# Patient Record
Sex: Male | Born: 1995 | Race: Black or African American | Hispanic: No | Marital: Single | Smoking: Current some day smoker
Health system: Southern US, Community
[De-identification: ages and names within clinical notes are randomized; demographics above are authoritative.]

## PROBLEM LIST (undated history)

## (undated) DIAGNOSIS — R011 Cardiac murmur, unspecified: Secondary | ICD-10-CM

## (undated) DIAGNOSIS — S82899A Other fracture of unspecified lower leg, initial encounter for closed fracture: Secondary | ICD-10-CM

## (undated) DIAGNOSIS — Z8781 Personal history of (healed) traumatic fracture: Secondary | ICD-10-CM

## (undated) HISTORY — PX: OTHER SURGICAL HISTORY: SHX169

---
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
## 2014-05-18 ENCOUNTER — Encounter (HOSPITAL_COMMUNITY): Payer: Self-pay

## 2014-05-18 ENCOUNTER — Emergency Department (HOSPITAL_COMMUNITY): Payer: Medicaid Other

## 2014-05-18 ENCOUNTER — Emergency Department (HOSPITAL_COMMUNITY)
Admission: EM | Admit: 2014-05-18 | Discharge: 2014-05-18 | Disposition: A | Discharge status: Home / Self Care | Payer: Medicaid Other | Attending: Emergency Medicine | Admitting: Emergency Medicine

## 2014-05-18 DIAGNOSIS — R011 Cardiac murmur, unspecified: Secondary | ICD-10-CM | POA: Diagnosis not present

## 2014-05-18 DIAGNOSIS — R6889 Other general symptoms and signs: Secondary | ICD-10-CM

## 2014-05-18 DIAGNOSIS — R197 Diarrhea, unspecified: Secondary | ICD-10-CM | POA: Diagnosis not present

## 2014-05-18 DIAGNOSIS — Z8781 Personal history of (healed) traumatic fracture: Secondary | ICD-10-CM | POA: Diagnosis not present

## 2014-05-18 DIAGNOSIS — R112 Nausea with vomiting, unspecified: Secondary | ICD-10-CM | POA: Insufficient documentation

## 2014-05-18 DIAGNOSIS — Z7952 Long term (current) use of systemic steroids: Secondary | ICD-10-CM | POA: Insufficient documentation

## 2014-05-18 DIAGNOSIS — Z79899 Other long term (current) drug therapy: Secondary | ICD-10-CM | POA: Insufficient documentation

## 2014-05-18 DIAGNOSIS — R079 Chest pain, unspecified: Secondary | ICD-10-CM

## 2014-05-18 DIAGNOSIS — R05 Cough: Secondary | ICD-10-CM | POA: Insufficient documentation

## 2014-05-18 MED ORDER — ONDANSETRON 4 MG PO TBDP: 4.0000 mg | ORAL_TABLET | Freq: Three times a day (TID) | ORAL | Status: DC | PRN

## 2014-05-18 MED ORDER — HYDROCODONE-HOMATROPINE 5-1.5 MG/5ML PO SYRP
5.0000 mL | ORAL_SOLUTION | Freq: Once | ORAL | Status: AC
  Administered 2014-05-18: 5 mL via ORAL
  Filled 2014-05-18: qty 5

## 2014-05-18 MED ORDER — ONDANSETRON 4 MG PO TBDP: 8.0000 mg | ORAL_TABLET | Freq: Once | ORAL | Status: DC

## 2014-05-18 MED ORDER — ONDANSETRON 4 MG PO TBDP
4.0000 mg | ORAL_TABLET | Freq: Once | ORAL | Status: AC
Start: 2014-05-18 — End: 2014-05-18
  Administered 2014-05-18: 4 mg via ORAL
  Filled 2014-05-18: qty 1

## 2014-05-18 NOTE — ED Provider Notes (Signed)
  Face-to-face evaluation   History: He complains of intermittent chest pain associated with vomiting and diarrhea for several days.  Chest pain is left anterior, and resolves within 15 minutes.  He feels like the pain started before the vomiting.  He denies fever, chills, weakness or dizziness.  He complains of a chronic cardiac murmur but does not see a physician about it.  His never had surgery for it.  Physical exam: Alert, calm, cooperative.  Heart regular rate and rhythm without murmur.  Lungs clear to auscultation.  Abdomen soft and nontender.  Extremities without edema, and they are nontender.  Chest x-ray, ordered to evaluate for cardiomegaly   EKG Interpretation  Date/Time:  Sunday May 18 2014 15:10:16 EST Ventricular Rate:  57 PR Interval:  164 QRS Duration: 89 QT Interval:  386 QTC Calculation: 376 R Axis:   81 Text Interpretation:  Age not entered, assumed to be  18 years old for purpose of ECG interpretation Sinus rhythm Left ventricular hypertrophy ST elev, probable normal early repol pattern No old tracing to compare Confirmed by [facility]  MD, Demiana Crumbley [phone]([id number]) on 05/18/2014 3:13:41 PM        Medical screening examination/treatment/procedure(s) were conducted as a shared visit with non-physician practitioner(s) and myself.  I personally evaluated the patient during the encounter  Flint MelterElliott L Kyson Kupper, MD 05/18/14 2342

## 2014-05-18 NOTE — Discharge Instructions (Signed)
Please follow up with your primary care physician in 1-2 days. If you do not have one please call the [facility] number listed above. Please alternate between Motrin and Tylenol every three hours for fevers and pain. Please read all discharge instructions and return precautions.   Chest Pain (Nonspecific) It is often hard to give a specific diagnosis for the cause of chest pain. There is always a chance that your pain could be related to something serious, such as a heart attack or a blood clot in the lungs. You need to follow up with your health care provider for further evaluation. CAUSES   Heartburn.  Pneumonia or bronchitis.  Anxiety or stress.  Inflammation around your heart (pericarditis) or lung (pleuritis or pleurisy).  A blood clot in the lung.  A collapsed lung (pneumothorax). It can develop suddenly on its own (spontaneous pneumothorax) or from trauma to the chest.  Shingles infection (herpes zoster virus). The chest wall is composed of bones, muscles, and cartilage. Any of these can be the source of the pain.  The bones can be bruised by injury.  The muscles or cartilage can be strained by coughing or overwork.  The cartilage can be affected by inflammation and become sore (costochondritis). DIAGNOSIS  Lab tests or other studies may be needed to find the cause of your pain. Your health care provider may have you take a test called an ambulatory electrocardiogram (ECG). An ECG records your heartbeat patterns over a 24-hour period. You may also have other tests, such as:  Transthoracic echocardiogram (TTE). During echocardiography, sound waves are used to evaluate how blood flows through your heart.  Transesophageal echocardiogram (TEE).  Cardiac monitoring. This allows your health care provider to monitor your heart rate and rhythm in real time.  Holter monitor. This is a portable device that records your heartbeat and can help diagnose heart  arrhythmias. It allows your health care provider to track your heart activity for several days, if needed.  Stress tests by exercise or by giving medicine that makes the heart beat faster. TREATMENT   Treatment depends on what may be causing your chest pain. Treatment may include:  Acid blockers for heartburn.  Anti-inflammatory medicine.  Pain medicine for inflammatory conditions.  Antibiotics if an infection is present.  You may be advised to change lifestyle habits. This includes stopping smoking and avoiding alcohol, caffeine, and chocolate.  You may be advised to keep your head raised (elevated) when sleeping. This reduces the chance of acid going backward from your stomach into your esophagus. Most of the time, nonspecific chest pain will improve within 2-3 days with rest and mild pain medicine.  HOME CARE INSTRUCTIONS   If antibiotics were prescribed, take them as directed. Finish them even if you start to feel better.  For the next few days, avoid physical activities that bring on chest pain. Continue physical activities as directed.  Do not use any tobacco products, including cigarettes, chewing tobacco, or electronic cigarettes.  Avoid drinking alcohol.  Only take medicine as directed by your health care provider.  Follow your health care provider's suggestions for further testing if your chest pain does not go away.  Keep any follow-up appointments you made. If you do not go to an appointment, you could develop lasting (chronic) problems with pain. If there is any problem keeping an appointment, call to reschedule. SEEK MEDICAL CARE IF:   Your chest pain does not go away, even after treatment.  You have  a rash with blisters on your chest.  You have a fever. SEEK IMMEDIATE MEDICAL CARE IF:   You have increased chest pain or pain that spreads to your arm, neck, jaw, back, or abdomen.  You have shortness of breath.  You have an increasing cough, or you cough up  blood.  You have severe back or abdominal pain.  You feel nauseous or vomit.  You have severe weakness.  You faint.  You have chills. This is an emergency. Do not wait to see if the pain will go away. Get medical help at once. Call your local emergency services (911 in [place] Do not drive yourself to the hospital. MAKE SURE YOU:   Understand these instructions.  Will watch your condition.  Will get help right away if you are not doing well or get worse. Document Released: 02/23/2005 Document Revised: 05/21/2013 Document Reviewed: 12/20/2007 [facility]ExitCare Patient Information 2015 [employer] [place]. This information is not intended to replace advice given to you by your health care provider. Make sure you discuss any questions you have with your health care provider.   Influenza Influenza ("the flu") is a viral infection of the respiratory tract. It occurs more often in winter months because people spend more time in close contact with one another. Influenza can make you feel very sick. Influenza easily spreads from person to person (contagious). CAUSES  Influenza is caused by a virus that infects the respiratory tract. You can catch the virus by breathing in droplets from an infected person's cough or sneeze. You can also catch the virus by touching something that was recently contaminated with the virus and then touching your mouth, nose, or eyes. RISKS AND COMPLICATIONS You may be at risk for a more severe case of influenza if you smoke cigarettes, have diabetes, have chronic heart disease (such as heart failure) or lung disease (such as asthma), or if you have a weakened immune system. Elderly people and pregnant women are also at risk for more serious infections. The most common problem of influenza is a lung infection (pneumonia). Sometimes, this problem can require emergency medical care and may be life threatening. SIGNS AND SYMPTOMS  Symptoms typically last 4 to 10 days and may  include:  Fever.  Chills.  Headache, body aches, and muscle aches.  Sore throat.  Chest discomfort and cough.  Poor appetite.  Weakness or feeling tired.  Dizziness.  Nausea or vomiting. DIAGNOSIS  Diagnosis of influenza is often made based on your history and a physical exam. A nose or throat swab test can be done to confirm the diagnosis. TREATMENT  In mild cases, influenza goes away on its own. Treatment is directed at relieving symptoms. For more severe cases, your health care provider may prescribe antiviral medicines to shorten the sickness. Antibiotic medicines are not effective because the infection is caused by a virus, not by bacteria. HOME CARE INSTRUCTIONS  Take medicines only as directed by your health care provider.  Use a cool mist humidifier to make breathing easier.  Get plenty of rest until your temperature returns to normal. This usually takes 3 to 4 days.  Drink enough fluid to keep your urine clear or pale yellow.  Cover yourmouth and nosewhen coughing or sneezing,and wash your handswellto prevent thevirusfrom spreading.  Stay homefromwork orschool untilthe fever is gonefor at least 111full day. PREVENTION  An annual influenza vaccination (flu shot) is the best way to avoid getting influenza. An annual flu shot is now routinely recommended for all adults  in the [place] SEEK MEDICAL CARE IF:  You experiencechest pain, yourcough worsens,or you producemore mucus.  Youhave nausea,vomiting, ordiarrhea.  Your fever returns or gets worse. SEEK IMMEDIATE MEDICAL CARE IF:  You havetrouble breathing, you become short of breath,or your skin ornails becomebluish.  You have severe painor stiffnessin the neck.  You develop a sudden headache, or pain in the face or ear.  You have nausea or vomiting that you cannot control. MAKE SURE YOU:   Understand these instructions.  Will watch your condition.  Will get help right away if you  are not doing well or get worse. Document Released: 05/13/2000 Document Revised: 09/30/2013 Document Reviewed: 08/15/2011 [facility] Patient Information 2015 [place], [place]. This information is not intended to replace advice given to you by your health care provider. Make sure you discuss any questions you have with your health care provider.

## 2014-05-18 NOTE — ED Notes (Signed)
Per ems - c/o left-sided chest pain in mid-rib area w/ no radiation, 8/10. Pt reports nonproductive cough, pain increases with palpation and deep resp. No cardiac history. Pt c/o vomiting on and off over last few days and increased bowel movements. Chest pain has been the past 3 days. BP 136 palp, pulse 60, resp 16.

## 2014-05-18 NOTE — ED Provider Notes (Signed)
CSN: [id number]     Arrival date & time 05/18/14  1510 History   First MD Initiated Contact with Patient 05/18/14 1516     Chief Complaint  Patient presents with  . Chest Pain     (Consider location/radiation/quality/duration/timing/severity/associated sxs/prior Treatment) HPI Comments: Patient is a 18 yo M PMHx significant for heart murmur presenting to the ED for left sided chest pain with associated non-productive cough, nasal congestion, rhinorrhea, nausea, non-bloody non-bilious emesis, and non-bloody diarrhea for the last three days. He states his chest pain worsened today. No medications tried PTA. No sick contacts. PERC negative. No early familial cardiac history.   Patient is a 18 y.o. male presenting with chest pain.  Chest Pain Associated symptoms: cough, nausea and vomiting     Past Medical History  Diagnosis Date  . Heart murmur   . Broken ankle   . History of broken finger    History reviewed. No pertinent past surgical history. History reviewed. No pertinent family history. History  Substance Use Topics  . Smoking status: Never Smoker   . Smokeless tobacco: Not on file  . Alcohol Use: No    Review of Systems  HENT: Positive for congestion and rhinorrhea.   Respiratory: Positive for cough.   Cardiovascular: Positive for chest pain.  Gastrointestinal: Positive for nausea, vomiting and diarrhea.  All other systems reviewed and are negative.     Allergies  Review of patient's allergies indicates no known allergies.  Home Medications   Prior to Admission medications   Medication Sig Start Date End Date Taking? Authorizing Provider  acetaminophen (TYLENOL) 325 MG tablet Take 650 mg by mouth every 6 (six) hours as needed for headache.   Yes Historical Provider, MD  diphenhydrAMINE (BENADRYL) 25 MG tablet Take 1 tablet (25 mg total) by mouth every 6 (six) hours. Patient not taking: Reported on 05/18/2014 12/28/13   Trixie DredgeEmily West, PA-C  ibuprofen (ADVIL,MOTRIN)  800 MG tablet Take 800 mg by mouth every 8 (eight) hours as needed for mild pain.    Historical Provider, MD  ondansetron (ZOFRAN ODT) 4 MG disintegrating tablet Take 1 tablet (4 mg total) by mouth every 8 (eight) hours as needed for nausea or vomiting. 05/18/14   Lise AuerJennifer L Matisyn Cabeza, PA-C  permethrin (ELIMITE) 5 % cream Apply to affected area once.  Repeat once in 7 days if needed. Patient not taking: Reported on 05/18/2014 12/28/13   Trixie DredgeEmily West, PA-C  predniSONE (STERAPRED UNI-PAK) 10 MG tablet Take by mouth daily. Day 1: take 6 tabs.  Day 2: 5 tabs  Day 3: 4 tabs  Day 4: 3 tabs  Day 5: 2 tabs  Day 6: 1 tab Patient not taking: Reported on 05/18/2014 12/28/13   Trixie DredgeEmily West, PA-C  trimethoprim-polymyxin b (POLYTRIM) ophthalmic solution Place 1 drop into the right eye every 4 (four) hours. While awake X 7 days Patient not taking: Reported on 05/18/2014 12/28/13   Trixie DredgeEmily West, PA-C   BP 138/84 mmHg  Pulse 42  Temp(Src) 98 F (36.7 C) (Oral)  Resp 14  SpO2 100% Physical Exam  Constitutional: He is oriented to person, place, and time. He appears well-developed and well-nourished. No distress.  HENT:  Head: Normocephalic and atraumatic.  Right Ear: External ear normal.  Left Ear: External ear normal.  Nose: Rhinorrhea present.  Mouth/Throat: Oropharynx is clear and moist. No oropharyngeal exudate.  Nasal congestion.   Eyes: Conjunctivae are normal.  Neck: Normal range of motion. Neck supple.  Cardiovascular: Normal rate, regular rhythm  and normal heart sounds.   Pulmonary/Chest: Effort normal and breath sounds normal. No respiratory distress. He exhibits tenderness. He exhibits no crepitus, no deformity and no swelling.    Abdominal: Soft. Bowel sounds are normal. There is no tenderness.  Musculoskeletal: Normal range of motion. He exhibits no edema.  Lymphadenopathy:    He has no cervical adenopathy.  Neurological: He is alert and oriented to person, place, and time.  Skin: Skin is warm and  dry. He is not diaphoretic.  Psychiatric: He has a normal mood and affect.  Nursing note and vitals reviewed.   ED Course  Procedures (including critical care time) Medications  ondansetron (ZOFRAN-ODT) disintegrating tablet 4 mg (4 mg Oral Given 05/18/14 1544)  HYDROcodone-homatropine (HYCODAN) 5-1.5 MG/5ML syrup 5 mL (5 mLs Oral Given 05/18/14 1544)    Labs Review Labs Reviewed - No data to display  Imaging Review Dg Chest 2 View  05/18/2014   CLINICAL DATA:  Left-sided chest pain and cough for 3 days. Shortness of breath for 1 day.  EXAM: CHEST  2 VIEW  COMPARISON:  None.  FINDINGS: Heart size and mediastinal contours are within normal limits. Both lungs are clear. Visualized skeletal structures are unremarkable.  IMPRESSION: Negative exam.   Electronically Signed   By: Drusilla Kannerhomas  Dalessio M.D.   On: 05/18/2014 16:26     EKG Interpretation   Date/Time:  Sunday May 18 2014 15:10:16 EST Ventricular Rate:  57 PR Interval:  164 QRS Duration: 89 QT Interval:  386 QTC Calculation: 376 R Axis:   81 Text Interpretation:  Age not entered, assumed to be  18 years old for  purpose of ECG interpretation Sinus rhythm Left ventricular hypertrophy ST  elev, probable normal early repol pattern No old tracing to compare  Confirmed by [facility]  MD, ELLIOTT [phone]([id number]) on 05/18/2014 3:13:41 PM      MDM   Final diagnoses:  Chest pain  Flu-like symptoms    Filed Vitals:   05/18/14 1640  BP: 138/84  Pulse: 42  Temp:   Resp: 14   Afebrile, NAD, non-toxic appearing, AAOx4.   1) Flu like symptoms: Patient with symptoms consistent with influenza.  Vitals are stable, low-grade fever.  No signs of dehydration, tolerating PO's.  Lungs are clear.    2) CP: Patient is to be discharged with recommendation to follow up with PCP in regards to today's hospital visit. Chest pain is not likely of cardiac or pulmonary etiology d/t presentation, perc negative, VSS, no tracheal deviation, no JVD or  new murmur, RRR, breath sounds equal bilaterally, EKG without acute abnormalities, negative troponin, and negative CXR. Pt has been advised to return to the ED is CP becomes exertional, associated with diaphoresis or nausea, radiates to left jaw/arm, worsens or becomes concerning in any way. Pt appears reliable for follow up and is agreeable to discharge.   Case has been discussed with and seen by Dr. Effie ShyWentz who agrees with the above plan to discharge.       Jeannetta EllisJennifer L Janiece Scovill, PA-C 05/18/14 2018  Flint MelterElliott L Wentz, MD 05/18/14 [id number]

## 2014-06-29 ENCOUNTER — Encounter (HOSPITAL_COMMUNITY): Payer: Self-pay | Admitting: Cardiology

## 2014-06-29 ENCOUNTER — Emergency Department (HOSPITAL_COMMUNITY)
Admission: EM | Admit: 2014-06-29 | Discharge: 2014-06-29 | Disposition: A | Discharge status: Home / Self Care | Payer: Medicaid Other | Attending: Emergency Medicine | Admitting: Emergency Medicine

## 2014-06-29 DIAGNOSIS — S39012A Strain of muscle, fascia and tendon of lower back, initial encounter: Secondary | ICD-10-CM | POA: Diagnosis not present

## 2014-06-29 DIAGNOSIS — Z7952 Long term (current) use of systemic steroids: Secondary | ICD-10-CM | POA: Insufficient documentation

## 2014-06-29 DIAGNOSIS — R011 Cardiac murmur, unspecified: Secondary | ICD-10-CM | POA: Insufficient documentation

## 2014-06-29 DIAGNOSIS — S3992XA Unspecified injury of lower back, initial encounter: Secondary | ICD-10-CM | POA: Diagnosis present

## 2014-06-29 DIAGNOSIS — Y998 Other external cause status: Secondary | ICD-10-CM | POA: Diagnosis not present

## 2014-06-29 DIAGNOSIS — X58XXXA Exposure to other specified factors, initial encounter: Secondary | ICD-10-CM | POA: Diagnosis not present

## 2014-06-29 DIAGNOSIS — Y9289 Other specified places as the place of occurrence of the external cause: Secondary | ICD-10-CM | POA: Diagnosis not present

## 2014-06-29 DIAGNOSIS — Y9389 Activity, other specified: Secondary | ICD-10-CM | POA: Diagnosis not present

## 2014-06-29 MED ORDER — NAPROXEN 500 MG PO TABS: 500.0000 mg | ORAL_TABLET | Freq: Two times a day (BID) | ORAL | Status: DC

## 2014-06-29 MED ORDER — METHOCARBAMOL 500 MG PO TABS: 1000.0000 mg | ORAL_TABLET | Freq: Four times a day (QID) | ORAL | Status: DC

## 2014-06-29 NOTE — ED Provider Notes (Signed)
CSN: [id number]     Arrival date & time 06/29/14  1151 History   First MD Initiated Contact with Patient 06/29/14 1248     Chief Complaint  Patient presents with  . Back Pain     (Consider location/radiation/quality/duration/timing/severity/associated sxs/prior Treatment) HPI Comments: Patient present with complaint of lower back pain for the past 2 days. Pain is across his lower back. It is dull and worse with movement. It does not radiate into his legs. Patient took Tylenol without relief. Pain was worse this morning. Patient denies warning symptoms of back pain including: fecal incontinence, urinary retention or overflow incontinence, night sweats, waking from sleep with back pain, unexplained fevers or weight loss, h/o cancer, IVDU, recent trauma.  No history of back problems.   Patient is a 19 y.o. male presenting with back pain. The history is provided by the patient.  Back Pain Associated symptoms: no fever, no numbness and no weakness     Past Medical History  Diagnosis Date  . Heart murmur   . Broken ankle   . History of broken finger    History reviewed. No pertinent past surgical history. History reviewed. No pertinent family history. History  Substance Use Topics  . Smoking status: Never Smoker   . Smokeless tobacco: Not on file  . Alcohol Use: No    Review of Systems  Constitutional: Negative for fever and unexpected weight change.  Gastrointestinal: Negative for constipation.       Neg for fecal incontinence  Genitourinary: Negative for hematuria, flank pain and difficulty urinating.       Negative for urinary incontinence or retention  Musculoskeletal: Positive for back pain.  Neurological: Negative for weakness and numbness.       Negative for saddle paresthesias       Allergies  Review of patient's allergies indicates no known allergies.  Home Medications   Prior to Admission medications   Medication Sig Start Date End Date Taking? Authorizing  Provider  acetaminophen (TYLENOL) 325 MG tablet Take 650 mg by mouth every 6 (six) hours as needed for headache.    Historical Provider, MD  diphenhydrAMINE (BENADRYL) 25 MG tablet Take 1 tablet (25 mg total) by mouth every 6 (six) hours. Patient not taking: Reported on 05/18/2014 12/28/13   Trixie DredgeEmily West, PA-C  ibuprofen (ADVIL,MOTRIN) 800 MG tablet Take 800 mg by mouth every 8 (eight) hours as needed for mild pain.    Historical Provider, MD  ondansetron (ZOFRAN ODT) 4 MG disintegrating tablet Take 1 tablet (4 mg total) by mouth every 8 (eight) hours as needed for nausea or vomiting. 05/18/14   Lise AuerJennifer L Piepenbrink, PA-C  permethrin (ELIMITE) 5 % cream Apply to affected area once.  Repeat once in 7 days if needed. Patient not taking: Reported on 05/18/2014 12/28/13   Trixie DredgeEmily West, PA-C  predniSONE (STERAPRED UNI-PAK) 10 MG tablet Take by mouth daily. Day 1: take 6 tabs.  Day 2: 5 tabs  Day 3: 4 tabs  Day 4: 3 tabs  Day 5: 2 tabs  Day 6: 1 tab Patient not taking: Reported on 05/18/2014 12/28/13   Trixie DredgeEmily West, PA-C  trimethoprim-polymyxin b (POLYTRIM) ophthalmic solution Place 1 drop into the right eye every 4 (four) hours. While awake X 7 days Patient not taking: Reported on 05/18/2014 12/28/13   Trixie DredgeEmily West, PA-C   BP 130/63 mmHg  Pulse 67  Temp(Src) 98.1 F (36.7 C) (Oral)  Resp 16  SpO2 97% Physical Exam  Constitutional: He appears well-developed and  well-nourished.  HENT:  Head: Normocephalic and atraumatic.  Eyes: Conjunctivae are normal.  Neck: Normal range of motion.  Abdominal: Soft. There is no tenderness. There is no CVA tenderness.  Musculoskeletal: Normal range of motion.       Cervical back: He exhibits normal range of motion, no tenderness and no bony tenderness.       Thoracic back: He exhibits normal range of motion, no tenderness and no bony tenderness.       Lumbar back: He exhibits tenderness. He exhibits normal range of motion and no bony tenderness.       Back:  No step-off  noted with palpation of spine.   Neurological: He is alert. He has normal reflexes. No sensory deficit. He exhibits normal muscle tone.  5/5 strength in entire lower extremities bilaterally. No sensation deficit.   Skin: Skin is warm and dry.  Psychiatric: He has a normal mood and affect.  Nursing note and vitals reviewed.   ED Course  Procedures (including critical care time) Labs Review Labs Reviewed - No data to display  Imaging Review No results found.   EKG Interpretation None       12:53 PM Patient seen and examined. Work-up initiated. Medications ordered.   Vital signs reviewed and are as follows: Filed Vitals:   06/29/14 1212  BP: 130/63  Pulse: 67  Temp: 98.1 F (36.7 C)  Resp: 16    No red flag s/s of low back pain. Patient was counseled on back pain precautions and told to do activity as tolerated but do not lift, push, or pull heavy objects more than 10 pounds for the next week.  Patient counseled to use ice or heat on back for no longer than 15 minutes every hour.   Patient prescribed muscle relaxer and counseled on proper use of muscle relaxant medication.    Urged patient not to drink alcohol, drive, or perform any other activities that requires focus while taking either of these medications.  Patient urged to follow-up with PCP if pain does not improve with treatment and rest or if pain becomes recurrent. Urged to return with worsening severe pain, loss of bowel or bladder control, trouble walking.   The patient verbalizes understanding and agrees with the plan.     MDM   Final diagnoses:  Lumbar strain, initial encounter   Patient with back pain, musculoskeletal in nature. No neurological deficits. Patient is ambulatory. No warning symptoms of back pain including: fecal incontinence, urinary retention or overflow incontinence, night sweats, waking from sleep with back pain, unexplained fevers or weight loss, h/o cancer, IVDU, recent trauma. No  concern for cauda equina, epidural abscess, or other serious cause of back pain. Conservative measures such as rest, ice/heat and pain medicine indicated with PCP follow-up if no improvement with conservative management.      Renne Crigler, PA-C 06/29/14 1301  Gerhard Munch, MD 06/29/14 [phone]

## 2014-06-29 NOTE — Discharge Instructions (Signed)
Please read and follow all provided instructions.  Your diagnoses today include:  1. Lumbar strain, initial encounter    Tests performed today include:  Vital signs - see below for your results today  Medications prescribed:   Robaxin (methocarbamol) - muscle relaxer medication  DO NOT drive or perform any activities that require you to be awake and alert because this medicine can make you drowsy.    Naproxen - anti-inflammatory pain medication  Do not exceed 500mg  naproxen every 12 hours, take with food  You have been prescribed an anti-inflammatory medication or NSAID. Take with food. Take smallest effective dose for the shortest duration needed for your pain. Stop taking if you experience stomach pain or vomiting.   Take any prescribed medications only as directed.  Home care instructions:   Follow any educational materials contained in this packet  Please rest, use ice or heat on your back for the next several days  Do not lift, push, pull anything more than 10 pounds for the next week  Follow-up instructions: Please follow-up with your primary care provider in the next 1 week for further evaluation of your symptoms.   Return instructions:  SEEK IMMEDIATE MEDICAL ATTENTION IF YOU HAVE:  New numbness, tingling, weakness, or problem with the use of your arms or legs  Severe back pain not relieved with medications  Loss control of your bowels or bladder  Increasing pain in any areas of the body (such as chest or abdominal pain)  Shortness of breath, dizziness, or fainting.   Worsening nausea (feeling sick to your stomach), vomiting, fever, or sweats  Any other emergent concerns regarding your health   Additional Information:  Your vital signs today were: BP 130/63 mmHg   Pulse 67   Temp(Src) 98.1 F (36.7 C) (Oral)   Resp 16   SpO2 97% If your blood pressure (BP) was elevated above 135/85 this visit, please have this repeated by your doctor within one  month. --------------

## 2014-06-29 NOTE — ED Notes (Signed)
Pt reports lower back pain for the past couple of days. 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

## 2014-06-29 NOTE — ED Notes (Signed)
Declined W/C at D/C and was escorted to lobby by RN. 

## 2014-09-23 ENCOUNTER — Emergency Department (HOSPITAL_COMMUNITY): Payer: Medicaid Other

## 2014-09-23 ENCOUNTER — Encounter (HOSPITAL_COMMUNITY): Payer: Self-pay | Admitting: Emergency Medicine

## 2014-09-23 ENCOUNTER — Emergency Department (HOSPITAL_COMMUNITY)
Admission: EM | Admit: 2014-09-23 | Discharge: 2014-09-23 | Disposition: A | Discharge status: Home / Self Care | Payer: Medicaid Other | Attending: Emergency Medicine | Admitting: Emergency Medicine

## 2014-09-23 DIAGNOSIS — Z87828 Personal history of other (healed) physical injury and trauma: Secondary | ICD-10-CM | POA: Insufficient documentation

## 2014-09-23 DIAGNOSIS — Z791 Long term (current) use of non-steroidal anti-inflammatories (NSAID): Secondary | ICD-10-CM | POA: Diagnosis not present

## 2014-09-23 DIAGNOSIS — Z79899 Other long term (current) drug therapy: Secondary | ICD-10-CM | POA: Diagnosis not present

## 2014-09-23 DIAGNOSIS — R011 Cardiac murmur, unspecified: Secondary | ICD-10-CM | POA: Insufficient documentation

## 2014-09-23 DIAGNOSIS — Y9389 Activity, other specified: Secondary | ICD-10-CM | POA: Diagnosis not present

## 2014-09-23 DIAGNOSIS — Y9289 Other specified places as the place of occurrence of the external cause: Secondary | ICD-10-CM | POA: Diagnosis not present

## 2014-09-23 DIAGNOSIS — Y999 Unspecified external cause status: Secondary | ICD-10-CM | POA: Diagnosis not present

## 2014-09-23 DIAGNOSIS — W2209XA Striking against other stationary object, initial encounter: Secondary | ICD-10-CM | POA: Insufficient documentation

## 2014-09-23 DIAGNOSIS — S6991XA Unspecified injury of right wrist, hand and finger(s), initial encounter: Secondary | ICD-10-CM | POA: Diagnosis present

## 2014-09-23 DIAGNOSIS — S62336A Displaced fracture of neck of fifth metacarpal bone, right hand, initial encounter for closed fracture: Secondary | ICD-10-CM | POA: Insufficient documentation

## 2014-09-23 DIAGNOSIS — S62308A Unspecified fracture of other metacarpal bone, initial encounter for closed fracture: Secondary | ICD-10-CM

## 2014-09-23 MED ORDER — HYDROCODONE-ACETAMINOPHEN 5-325 MG PO TABS: ORAL_TABLET | ORAL | Status: DC

## 2014-09-23 MED ORDER — NAPROXEN 500 MG PO TABS: 500.0000 mg | ORAL_TABLET | Freq: Two times a day (BID) | ORAL | Status: DC

## 2014-09-23 MED ORDER — OXYCODONE-ACETAMINOPHEN 5-325 MG PO TABS
1.0000 | ORAL_TABLET | Freq: Once | ORAL | Status: AC
  Administered 2014-09-23: 1 via ORAL
  Filled 2014-09-23: qty 1

## 2014-09-23 NOTE — ED Provider Notes (Signed)
CSN: [id number]     Arrival date & time 09/23/14  1345 History  This chart was scribed for Sean BleacherJosh Devan Babino, PA working with Vanetta MuldersScott Zackowski, MD by Elveria Risingimelie Horne, ED Scribe. This patient was seen in room TR05C/TR05C and the patient's care was started at 2:14 PM.   CC: R hand pain and swelling  The history is provided by the patient. No language interpreter was used.   HPI Comments: Sean Conway is a 19 y.o. male who presents to the Emergency Department right hand injury incurred yesterday. Patient uncertain of the mechanism of his injury but suspects he sustained it during rough horseplay with his girlfriend. Patient reports that immediately following the injury he did experience some mild pain, but he ignored it. Patient reports waking up this morning with swelling and pain to dorsum, discomfort with forming a fist and dangling his hand. Patient reports attempted treatment with Tylenol and ibuprofen without relief.   Past Medical History  Diagnosis Date  . Heart murmur   . Broken ankle   . History of broken finger    History reviewed. No pertinent past surgical history. No family history on file. History  Substance Use Topics  . Smoking status: Never Smoker   . Smokeless tobacco: Not on file  . Alcohol Use: No    Review of Systems  Constitutional: Positive for activity change. Negative for fever.  Musculoskeletal: Positive for joint swelling and arthralgias. Negative for back pain, gait problem and neck pain.  Skin: Negative for color change and wound.  Neurological: Negative for weakness and numbness.    Allergies  Review of patient's allergies indicates no known allergies.  Home Medications   Prior to Admission medications   Medication Sig Start Date End Date Taking? Authorizing Provider  acetaminophen (TYLENOL) 325 MG tablet Take 650 mg by mouth every 6 (six) hours as needed for headache.    Historical Provider, MD  ibuprofen (ADVIL,MOTRIN) 800 MG tablet Take 800 mg by mouth  every 8 (eight) hours as needed for mild pain.    Historical Provider, MD  methocarbamol (ROBAXIN) 500 MG tablet Take 2 tablets (1,000 mg total) by mouth 4 (four) times daily. 06/29/14   Renne CriglerJoshua Oneil Behney, PA-C  naproxen (NAPROSYN) 500 MG tablet Take 1 tablet (500 mg total) by mouth 2 (two) times daily. 06/29/14   Renne CriglerJoshua Kieron Kantner, PA-C  ondansetron (ZOFRAN ODT) 4 MG disintegrating tablet Take 1 tablet (4 mg total) by mouth every 8 (eight) hours as needed for nausea or vomiting. 05/18/14   Francee PiccoloJennifer Piepenbrink, PA-C   Triage Vitals: 143/89 mmHg  Pulse 73  Temp(Src) 98.3 F (36.8 C) (Oral)  Resp 18  SpO2 97% Physical Exam  Constitutional: He is oriented to person, place, and time. He appears well-developed and well-nourished. No distress.  HENT:  Head: Normocephalic and atraumatic.  Eyes: Conjunctivae and EOM are normal.  Neck: Normal range of motion. Neck supple. No tracheal deviation present.  Cardiovascular: Normal rate and normal pulses.   Pulmonary/Chest: Effort normal. No respiratory distress.  Musculoskeletal: He exhibits tenderness. He exhibits no edema.       Right shoulder: Normal.       Right elbow: Normal.      Right wrist: He exhibits normal range of motion, no tenderness and no bony tenderness.       Right hand: He exhibits decreased range of motion, tenderness, bony tenderness and swelling. He exhibits normal capillary refill, no deformity and no laceration. Normal sensation noted. Normal strength noted.  Hands: Neurological: He is alert and oriented to person, place, and time. No sensory deficit.  Motor, sensation, and vascular distal to the injury is fully intact.   Skin: Skin is warm and dry.  Psychiatric: He has a normal mood and affect. His behavior is normal.  Nursing note and vitals reviewed.   ED Course  Procedures (including critical care time)  COORDINATION OF CARE: 2:19 PM- Plans to obtain imaging. Discussed treatment plan with patient at bedside and patient  agreed to plan.   Labs Review Labs Reviewed - No data to display  Imaging Review Dg Hand Complete Right  09/23/2014   CLINICAL DATA:  Right hand injury occurring yesterday. Initial encounter.  EXAM: RIGHT HAND - COMPLETE 3+ VIEW  COMPARISON:  10/16/2013  FINDINGS: Transverse fracture, extra-articular, through the proximal fifth metacarpal. The fracture is displaced dorsally and medially by approximately 20%. No dislocation.  IMPRESSION: Displaced proximal fifth metacarpal fracture.   Electronically Signed   By: Marnee Spring M.D.   On: 09/23/2014 15:19     EKG Interpretation None       Vital signs reviewed and are as follows: Filed Vitals:   09/23/14 1628  BP: 145/87  Pulse: 68  Temp:   Resp: 18   Mildly displaced right fifth metacarpal fracture noted. There are no immediate indications for reduction. Splint by orthopedic tech and sling as well. Pain medication given in emergency department. Orthopedic follow-up given.  Patient counseled on use of narcotic pain medications. Counseled not to combine these medications with others containing tylenol. Urged not to drink alcohol, drive, or perform any other activities that requires focus while taking these medications. The patient verbalizes understanding and agrees with the plan.   MDM   Final diagnoses:  Closed fracture of 5th metacarpal, initial encounter   Patient with metacarpal fracture as above. Splint placed to mobilize. Orthopedic follow-up given. Hand is neurovascularly intact.  I personally performed the services described in this documentation, which was scribed in my presence. The recorded information has been reviewed and is accurate.    Renne Crigler, PA-C 09/23/14 1634  Vanetta Mulders, MD 09/25/14 [phone]

## 2014-09-23 NOTE — Progress Notes (Signed)
Orthopedic Tech Progress Note Patient Details:  Sean Conway 11/02/1995 [id number]  Ortho Devices Type of Ortho Device: Ace wrap, Arm sling, Ulna gutter splint Ortho Device/Splint Location: RUE Ortho Device/Splint Interventions: Ordered, Application   Jennye MoccasinHughes, Bacilio Abascal Craig 09/23/2014, 4:12 PM

## 2014-09-23 NOTE — Discharge Instructions (Signed)
Please read and follow all provided instructions.  Your diagnoses today include:  1. Closed fracture of 5th metacarpal, initial encounter    Tests performed today include:  An x-ray of the affected area - shows broken 5th metacarpal bone  Vital signs. See below for your results today.   Medications prescribed:   Vicodin (hydrocodone/acetaminophen) - narcotic pain medication  DO NOT drive or perform any activities that require you to be awake and alert because this medicine can make you drowsy. BE VERY CAREFUL not to take multiple medicines containing Tylenol (also called acetaminophen). Doing so can lead to an overdose which can damage your liver and cause liver failure and possibly death.   Naproxen - anti-inflammatory pain medication  Do not exceed 500mg  naproxen every 12 hours, take with food  You have been prescribed an anti-inflammatory medication or NSAID. Take with food. Take smallest effective dose for the shortest duration needed for your pain. Stop taking if you experience stomach pain or vomiting.   Take any prescribed medications only as directed.  Home care instructions:   Follow any educational materials contained in this packet  Follow R.I.C.E. Protocol:  R - rest your injury   I  - use ice on injury without applying directly to skin  C - compress injury with bandage or splint  E - elevate the injury as much as possible  Follow-up instructions: Please follow-up with the provided orthopedic physician.   Return instructions:   Please return if your fingers are numb or tingling, appear gray or blue, or you have severe pain (also elevate the arm and loosen splint or wrap if you were given one)  Please return to the Emergency Department if you experience worsening symptoms.   Please return if you have any other emergent concerns.  Additional Information:  Your vital signs today were: BP 143/89 mmHg   Pulse 73   Temp(Src) 98.3 F (36.8 C) (Oral)   Resp  18   SpO2 97% If your blood pressure (BP) was elevated above 135/85 this visit, please have this repeated by your doctor within one month. --------------

## 2014-09-23 NOTE — ED Notes (Signed)
Pt c/o right hand pain and swelling. States "we were playing yesterday.Marland Kitchen.Marland Kitchen.I think I hit it on the table".

## 2014-10-03 ENCOUNTER — Encounter (HOSPITAL_COMMUNITY): Payer: Self-pay | Admitting: Emergency Medicine

## 2014-10-03 ENCOUNTER — Emergency Department (HOSPITAL_COMMUNITY)
Admission: EM | Admit: 2014-10-03 | Discharge: 2014-10-03 | Disposition: A | Discharge status: Home / Self Care | Payer: Medicaid Other | Attending: Emergency Medicine | Admitting: Emergency Medicine

## 2014-10-03 DIAGNOSIS — S62306D Unspecified fracture of fifth metacarpal bone, right hand, subsequent encounter for fracture with routine healing: Secondary | ICD-10-CM | POA: Diagnosis not present

## 2014-10-03 DIAGNOSIS — S62308D Unspecified fracture of other metacarpal bone, subsequent encounter for fracture with routine healing: Secondary | ICD-10-CM

## 2014-10-03 DIAGNOSIS — R011 Cardiac murmur, unspecified: Secondary | ICD-10-CM | POA: Diagnosis not present

## 2014-10-03 DIAGNOSIS — X58XXXD Exposure to other specified factors, subsequent encounter: Secondary | ICD-10-CM | POA: Diagnosis not present

## 2014-10-03 DIAGNOSIS — Z791 Long term (current) use of non-steroidal anti-inflammatories (NSAID): Secondary | ICD-10-CM | POA: Diagnosis not present

## 2014-10-03 DIAGNOSIS — Z79899 Other long term (current) drug therapy: Secondary | ICD-10-CM | POA: Insufficient documentation

## 2014-10-03 DIAGNOSIS — Z4689 Encounter for fitting and adjustment of other specified devices: Secondary | ICD-10-CM | POA: Diagnosis present

## 2014-10-03 MED ORDER — OXYCODONE-ACETAMINOPHEN 5-325 MG PO TABS: 1.0000 | ORAL_TABLET | Freq: Four times a day (QID) | ORAL | Status: DC | PRN

## 2014-10-03 MED ORDER — HYDROCODONE-ACETAMINOPHEN 5-325 MG PO TABS
1.0000 | ORAL_TABLET | Freq: Once | ORAL | Status: AC
  Administered 2014-10-03: 1 via ORAL
  Filled 2014-10-03: qty 1

## 2014-10-03 NOTE — Discharge Planning (Signed)
NCM consulted to aid pt in getting in touch with PCP on Medicaid card so he can obtain referral for hand surgery.  NCM called number and called various offices to find that MD Marda Stalker([facility]) is no longer practicing.  NCM informed pt that he will need to have PCP changed on Medicaid card ASAP to get referral for hand surgery.

## 2014-10-03 NOTE — Discharge Instructions (Signed)
Please read and follow all provided instructions.  Your diagnoses today include:  1. Closed fracture of 5th metacarpal, with routine healing, subsequent encounter     Tests performed today include:  Vital signs. See below for your results today.   Medications prescribed:   Percocet (oxycodone/acetaminophen) - narcotic pain medication  DO NOT drive or perform any activities that require you to be awake and alert because this medicine can make you drowsy. BE VERY CAREFUL not to take multiple medicines containing Tylenol (also called acetaminophen). Doing so can lead to an overdose which can damage your liver and cause liver failure and possibly death.  Take any prescribed medications only as directed.  Home care instructions:   Follow any educational materials contained in this packet  Follow R.I.C.E. Protocol:  R - rest your injury   I  - use ice on injury without applying directly to skin  C - compress injury with bandage or splint  E - elevate the injury as much as possible  Follow-up instructions: Please follow-up with Dr. Izora Ribasoley once you get your Medicaid figured out.   Return instructions:   Please return if your fingers are numb or tingling, appear gray or blue, or you have severe pain (also elevate the arm and loosen splint or wrap if you were given one)  Please return to the Emergency Department if you experience worsening symptoms.   Please return if you have any other emergent concerns.  Additional Information:  Your vital signs today were: BP 151/103 mmHg   Pulse 99   Temp(Src) 97.8 F (36.6 C) (Oral)   Resp 17   SpO2 100% If your blood pressure (BP) was elevated above 135/85 this visit, please have this repeated by your doctor within one month. --------------

## 2014-10-03 NOTE — ED Provider Notes (Signed)
CSN: [id number]     Arrival date & time 10/03/14  0815 History   First MD Initiated Contact with Patient 10/03/14 [phone]     Chief Complaint  Patient presents with  . needs referral to orthopedics      (Consider location/radiation/quality/duration/timing/severity/associated sxs/prior Treatment) HPI Comments: Patient presents for follow-up of his right fifth metacarpal fracture. Patient was seen by myself on 09/23/14. Patient was x-rayed and provided with a fiberglass splint. He was asked to follow-up with an orthopedic hand physician and a referral was given. Patient states that he has called the orthopedic office several times and was told that he needs a referral before he will be seen. He is unable to book an appointment. Patient is taking prescribed pain medication as directed. Patient states that the throbbing pain in his hand continues to wake him up from sleep at night. No new symptoms.  The history is provided by the patient.    Past Medical History  Diagnosis Date  . Heart murmur   . Broken ankle   . History of broken finger    History reviewed. No pertinent past surgical history. No family history on file. History  Substance Use Topics  . Smoking status: Never Smoker   . Smokeless tobacco: Not on file  . Alcohol Use: No    Review of Systems  Constitutional: Positive for activity change.  Musculoskeletal: Positive for arthralgias. Negative for back pain, joint swelling and gait problem.  Skin: Negative for wound.  Neurological: Negative for weakness and numbness.      Allergies  Review of patient's allergies indicates no known allergies.  Home Medications   Prior to Admission medications   Medication Sig Start Date End Date Taking? Authorizing Provider  acetaminophen (TYLENOL) 325 MG tablet Take 650 mg by mouth every 6 (six) hours as needed for headache.    Historical Provider, MD  HYDROcodone-acetaminophen (NORCO/VICODIN) 5-325 MG per tablet Take 1-2 tablets every  6 hours as needed for severe pain 09/23/14   Renne CriglerJoshua Keamber Macfadden, PA-C  ibuprofen (ADVIL,MOTRIN) 800 MG tablet Take 800 mg by mouth every 8 (eight) hours as needed for mild pain.    Historical Provider, MD  methocarbamol (ROBAXIN) 500 MG tablet Take 2 tablets (1,000 mg total) by mouth 4 (four) times daily. 06/29/14   Renne CriglerJoshua Phylliss Strege, PA-C  naproxen (NAPROSYN) 500 MG tablet Take 1 tablet (500 mg total) by mouth 2 (two) times daily. 09/23/14   Renne CriglerJoshua Anacaren Kohan, PA-C  ondansetron (ZOFRAN ODT) 4 MG disintegrating tablet Take 1 tablet (4 mg total) by mouth every 8 (eight) hours as needed for nausea or vomiting. 05/18/14   Victorino DikeJennifer Piepenbrink, PA-C   BP 151/103 mmHg  Pulse 99  Temp(Src) 97.8 F (36.6 C) (Oral)  Resp 17  SpO2 100%   Physical Exam  Constitutional: He appears well-developed and well-nourished.  HENT:  Head: Normocephalic and atraumatic.  Eyes: Conjunctivae are normal.  Neck: Normal range of motion. Neck supple.  Cardiovascular: Normal pulses.   Pulses:      Radial pulses are 2+ on the right side, and 2+ on the left side.  Musculoskeletal: He exhibits no edema.  Interval decrease in swelling over the right fourth and fifth metacarpals from my previous exam. Patient is still tender in these areas.  Neurological: He is alert. No sensory deficit.  Motor, sensation, and vascular distal to the injury is fully intact.   Skin: Skin is warm and dry.  Psychiatric: He has a normal mood and affect.  Nursing note  and vitals reviewed.   ED Course  Procedures (including critical care time) Labs Review Labs Reviewed - No data to display  Imaging Review No results found.   EKG Interpretation None       8:33 AM Patient seen and examined. I have a page out to the same ortho hand physician I referred patient to on the 26th for another patient. Will discuss.   Vital signs reviewed and are as follows: BP 151/103 mmHg  Pulse 99  Temp(Src) 97.8 F (36.6 C) (Oral)  Resp 17  SpO2 100%  9:41 AM  Discussed case with Dr. Debby Budoley's office. I understand the problem is with patient being able to contact his medicaid doctor. Case manager is here with patient discussing how to rectify this.    Case manager has assisted patient and plan is in place. Patient to follow-up with ortho hand as directed.  Patient counseled on use of narcotic pain medications. Counseled not to combine these medications with others containing tylenol. Urged not to drink alcohol, drive, or perform any other activities that requires focus while taking these medications. The patient verbalizes understanding and agrees with the plan.   Aging ulnar gutter splint has been replaced by orthopedic technician.  MDM   Final diagnoses:  Closed fracture of 5th metacarpal, with routine healing, subsequent encounter   Treatment as above. No sequelae of injury suspected to this point. Patient still needs to follow with ortho hand for definitive management.    Renne CriglerJoshua Tykeem Lanzer, PA-C 10/03/14 1331  Benjiman CoreNathan Pickering, MD 10/03/14 [phone]

## 2014-10-03 NOTE — ED Notes (Signed)
Called ortho and left message.

## 2014-10-03 NOTE — ED Notes (Signed)
Patient states here today because he called Dr. Debby Budoley's office and they advised that he "had to have a doctors referral".   Patient states that the ortho office told him that he needed one that the ED referral doesn't count.   Patient states he has medicaid and shouldn't have to pay $230.00 to ortho.

## 2014-11-02 ENCOUNTER — Telehealth: Payer: Self-pay | Admitting: Emergency Medicine

## 2014-11-18 ENCOUNTER — Emergency Department (INDEPENDENT_AMBULATORY_CARE_PROVIDER_SITE_OTHER): Payer: Medicaid Other

## 2014-11-18 ENCOUNTER — Emergency Department (INDEPENDENT_AMBULATORY_CARE_PROVIDER_SITE_OTHER)
Admission: EM | Admit: 2014-11-18 | Discharge: 2014-11-18 | Disposition: A | Discharge status: Home / Self Care | Payer: Medicaid Other | Source: Home / Self Care | Attending: Family Medicine | Admitting: Family Medicine

## 2014-11-18 ENCOUNTER — Encounter (HOSPITAL_COMMUNITY): Payer: Self-pay | Admitting: Emergency Medicine

## 2014-11-18 DIAGNOSIS — S62319D Displaced fracture of base of unspecified metacarpal bone, subsequent encounter for fracture with routine healing: Secondary | ICD-10-CM

## 2014-11-18 NOTE — ED Notes (Signed)
Patient broke hand in April, seen in ed x 2 .  Reports orthopedic specialist wanted referral from pcp, not acknowledging ed referral per patient.  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  Patient does not have any complaints about hand, no pain

## 2014-11-18 NOTE — ED Provider Notes (Signed)
CSN: [id number]     Arrival date & time 11/18/14  1407 History   First MD Initiated Contact with Patient 11/18/14 1503     Chief Complaint  Patient presents with  . Letter for School/Work  . Follow-up   (Consider location/radiation/quality/duration/timing/severity/associated sxs/prior Treatment) Patient is a 19 y.o. male presenting with hand injury. The history is provided by the patient.  Hand Injury Location:  Hand Time since incident:  2 months Injury: yes   Mechanism of injury comment:  Pt was playing roughly with family when injured right hand. in april currently no pain or limitation of fxn. Hand location:  [place] of R hand   Past Medical History  Diagnosis Date  . Heart murmur   . Broken ankle   . History of broken finger    History reviewed. No pertinent past surgical history. No family history on file. History  Substance Use Topics  . Smoking status: Never Smoker   . Smokeless tobacco: Not on file  . Alcohol Use: No    Review of Systems  Constitutional: Negative.   Musculoskeletal: Negative for joint swelling.  Skin: Negative.  Negative for wound.    Allergies  Review of patient's allergies indicates no known allergies.  Home Medications   Prior to Admission medications   Medication Sig Start Date End Date Taking? Authorizing Provider  acetaminophen (TYLENOL) 325 MG tablet Take 650 mg by mouth every 6 (six) hours as needed for headache.    Historical Provider, MD  ibuprofen (ADVIL,MOTRIN) 800 MG tablet Take 800 mg by mouth every 8 (eight) hours as needed for mild pain.    Historical Provider, MD  methocarbamol (ROBAXIN) 500 MG tablet Take 2 tablets (1,000 mg total) by mouth 4 (four) times daily. 06/29/14   Renne Crigler, PA-C  naproxen (NAPROSYN) 500 MG tablet Take 1 tablet (500 mg total) by mouth 2 (two) times daily. 09/23/14   Renne Crigler, PA-C  ondansetron (ZOFRAN ODT) 4 MG disintegrating tablet Take 1 tablet (4 mg total) by mouth every 8 (eight) hours as  needed for nausea or vomiting. 05/18/14   Francee Piccolo, PA-C  oxyCODONE-acetaminophen (PERCOCET/ROXICET) 5-325 MG per tablet Take 1-2 tablets by mouth every 6 (six) hours as needed for severe pain. 10/03/14   Renne Crigler, PA-C   BP 136/80 mmHg  Pulse 56  Temp(Src) 97.6 F (36.4 C)  Resp 12  SpO2 98% Physical Exam  Constitutional: He is oriented to person, place, and time. He appears well-developed and well-nourished. No distress.  Musculoskeletal: Normal range of motion. He exhibits no tenderness.       Right hand: Normal. He exhibits normal range of motion, no tenderness and no bony tenderness. Normal sensation noted. Normal strength noted.  Neurological: He is alert and oriented to person, place, and time.  Skin: Skin is warm and dry.  Nursing note and vitals reviewed.   ED Course  Procedures (including critical care time) Labs Review Labs Reviewed - No data to display  Imaging Review Dg Hand Complete Right  11/18/2014   CLINICAL DATA:  Right hand fracture.  EXAM: RIGHT HAND - COMPLETE 3+ VIEW  COMPARISON:  09/23/2014  FINDINGS: There is a healing fracture involving the base of the fifth metacarpal bone. Mild volar angulation of the distal fracture fragments noted. No new fracture deformities identified.  IMPRESSION: 1. Healing fracture involves the base of the fifth metacarpal bone.   Electronically Signed   By: Signa Kell M.D.   On: 11/18/2014 15:40   X-rays  reviewed and report per radiologist.   MDM   1. Fracture of metacarpal base of right hand, closed, with routine healing, subsequent encounter        Linna Hoff, MD 11/18/14 [phone]

## 2014-11-18 NOTE — Discharge Instructions (Signed)
Return as needed

## 2015-10-20 ENCOUNTER — Encounter (HOSPITAL_COMMUNITY): Payer: Self-pay | Admitting: Adult Health

## 2015-10-20 ENCOUNTER — Emergency Department (HOSPITAL_COMMUNITY)
Admission: EM | Admit: 2015-10-20 | Discharge: 2015-10-20 | Disposition: A | Discharge status: Home / Self Care | Payer: Medicaid Other | Attending: Emergency Medicine | Admitting: Emergency Medicine

## 2015-10-20 DIAGNOSIS — M546 Pain in thoracic spine: Secondary | ICD-10-CM | POA: Insufficient documentation

## 2015-10-20 DIAGNOSIS — Z8781 Personal history of (healed) traumatic fracture: Secondary | ICD-10-CM | POA: Insufficient documentation

## 2015-10-20 DIAGNOSIS — R011 Cardiac murmur, unspecified: Secondary | ICD-10-CM | POA: Insufficient documentation

## 2015-10-20 MED ORDER — LIDOCAINE 5 % EX PTCH: 1.0000 | MEDICATED_PATCH | CUTANEOUS | Status: DC

## 2015-10-20 MED ORDER — OXYCODONE-ACETAMINOPHEN 5-325 MG PO TABS: 1.0000 | ORAL_TABLET | ORAL | Status: DC | PRN

## 2015-10-20 MED ORDER — KETOROLAC TROMETHAMINE 60 MG/2ML IM SOLN
60.0000 mg | Freq: Once | INTRAMUSCULAR | Status: AC
  Administered 2015-10-20: 60 mg via INTRAMUSCULAR
  Filled 2015-10-20: qty 2

## 2015-10-20 MED ORDER — METHOCARBAMOL 500 MG PO TABS: 500.0000 mg | ORAL_TABLET | Freq: Two times a day (BID) | ORAL | Status: DC

## 2015-10-20 MED ORDER — LIDOCAINE 5 % EX PTCH
1.0000 | MEDICATED_PATCH | CUTANEOUS | Status: DC
  Administered 2015-10-20: 1 via TRANSDERMAL
  Filled 2015-10-20: qty 1

## 2015-10-20 MED ORDER — METHOCARBAMOL 500 MG PO TABS
1000.0000 mg | ORAL_TABLET | Freq: Once | ORAL | Status: AC
  Administered 2015-10-20: 1000 mg via ORAL
  Filled 2015-10-20: qty 2

## 2015-10-20 MED ORDER — OXYCODONE-ACETAMINOPHEN 5-325 MG PO TABS
1.0000 | ORAL_TABLET | Freq: Once | ORAL | Status: AC
  Administered 2015-10-20: 1 via ORAL
  Filled 2015-10-20: qty 1

## 2015-10-20 MED ORDER — NAPROXEN 500 MG PO TABS: 500.0000 mg | ORAL_TABLET | Freq: Two times a day (BID) | ORAL | Status: DC

## 2015-10-20 NOTE — Discharge Instructions (Signed)
You have been seen today for back pain. Quality attached instructions for pertinent back exercises. Expect your soreness to increase over the next 2-3 days. Take it easy, but do not lay around too much as this may make the stiffness worse. Take 500 mg of naproxen every 12 hours or 800 mg of ibuprofen every 8 hours for the next 3 days. Take these medications with food to avoid upset stomach. Robaxin as muscle relaxer and may help loosen stiff muscles. Percocet for severe pain. Do not take the Robaxin or Percocet while driving or performing other dangerous activities. Follow up with PCP as needed should symptoms continue. Return to ED should symptoms worsen.

## 2015-10-20 NOTE — ED Provider Notes (Signed)
CSN: [id number]     Arrival date & time 10/20/15  1957 History  By signing my name below, I, Sean Conway, attest that this documentation has been prepared under the direction and in the presence of Saretta Dahlem, PA-C. Electronically Signed: Octavia Conway, ED Scribe. 10/20/2015. 8:58 PM.    Chief Complaint  Patient presents with  . Back Pain     The history is provided by the patient. No language interpreter was used.   HPI Comments: Sean Conway is a 20 y.o. male with PMHx of heart murmur, who presents to the Emergency Department complaining of sudden onset, gradually worsening, 8.5/10, mid thoracic back pain that began three days ago. 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 Twisting or bending over worsens the pain. Pt is able to ambulate without difficulty. He states he has taken tylenol to alleviate his pain with no relief. Pt denies falls/trauma, fever, urinary or bowel complications, nausea/vomiting, neuro deficits, or any other complaints. Pt also denies previous back surgeries, HIV, or IV drug use.   Past Medical History  Diagnosis Date  . Heart murmur   . Broken ankle   . History of broken finger    History reviewed. No pertinent past surgical history. History reviewed. No pertinent family history. Social History  Substance Use Topics  . Smoking status: Never Smoker   . Smokeless tobacco: None  . Alcohol Use: No    Review of Systems  Constitutional: Negative for fever and chills.  Gastrointestinal: Negative for nausea and vomiting.  Genitourinary: Negative for difficulty urinating.  Musculoskeletal: Positive for back pain.  Neurological: Negative for dizziness, weakness, light-headedness, numbness and headaches.  All other systems reviewed and are negative.     Allergies  Review of patient's allergies indicates no known allergies.  Home Medications   Prior to Admission medications   Medication Sig Start Date End Date Taking?  Authorizing Provider  acetaminophen (TYLENOL) 500 MG tablet Take 500 mg by mouth every 6 (six) hours as needed for mild pain.   Yes Historical Provider, MD  lidocaine (LIDODERM) 5 % Place 1 patch onto the skin daily. Remove & Discard patch within 12 hours or as directed by MD 10/20/15   Anselm Pancoast, PA-C  methocarbamol (ROBAXIN) 500 MG tablet Take 1 tablet (500 mg total) by mouth 2 (two) times daily. 10/20/15   Mccauley Diehl C Cameron Katayama, PA-C  naproxen (NAPROSYN) 500 MG tablet Take 1 tablet (500 mg total) by mouth 2 (two) times daily. 10/20/15   Malorie Bigford C Khyree Carillo, PA-C  oxyCODONE-acetaminophen (PERCOCET/ROXICET) 5-325 MG tablet Take 1 tablet by mouth every 4 (four) hours as needed for severe pain. 10/20/15   Maricarmen Braziel C Lesette Frary, PA-C   Triage Vitals: BP 156/95 mmHg  Temp(Src) 98.4 F (36.9 C) (Oral)  Resp 18  SpO2 100% Physical Exam  Constitutional: He is oriented to person, place, and time. He appears well-developed and well-nourished. No distress.  HENT:  Head: Normocephalic and atraumatic.  Eyes: Conjunctivae are normal.  Neck: Normal range of motion. Neck supple.  Cardiovascular: Normal rate, regular rhythm and intact distal pulses.   Pulmonary/Chest: Effort normal. No respiratory distress.  Abdominal: Soft. There is no tenderness. There is no guarding.  Musculoskeletal: He exhibits tenderness. He exhibits no edema.  Tenderness to musculature flanking the thoracic spine. Full ROM in all extremities and spine. No paraspinal tenderness.   Lymphadenopathy:    He has no cervical adenopathy.  Neurological: He is alert and oriented to person, place, and  time. He has normal reflexes. No cranial nerve deficit.  No sensory deficits. Strength 5/5 in all extremities. No gait disturbance. Coordination intact.  Skin: Skin is warm and dry. He is not diaphoretic.  Psychiatric: He has a normal mood and affect. His behavior is normal.  Nursing note and vitals reviewed.   ED Course  Procedures  DIAGNOSTIC STUDIES: Oxygen  Saturation is 100% on RA, normal by my interpretation.  COORDINATION OF CARE: 8:52 PM Discussed treatment plan which includes antibiotic regimen for muscle strain with pt at bedside and pt agreed to plan. Pt is advised to return to ED if his symptoms are not alleviated. He was also advised to follow prescribed back exercises and he agreed to plan.    MDM   Final diagnoses:  Bilateral thoracic back pain    Cecilie KicksRaekwon S Laton presents with thoracic back pain that began 3 days ago.  Back pain with no signs of infection. Suspect muscle strain due to lifting. No neuro or functional deficits. No red flag symptoms. Home care and return precautions discussed. Patient voiced understanding of these instructions and is comfortable with discharge. Patient appears safe for discharge at this time.  I personally performed the services described in this documentation, which was scribed in my presence. The recorded information has been reviewed and is accurate.   Anselm PancoastShawn C Kaisyn Millea, PA-C 10/20/15 2323  Doug SouSam Jacubowitz, MD 10/20/15 [phone]

## 2015-10-20 NOTE — ED Notes (Addendum)
Presents with mid-low back pain began 3 days ago pain is worse with movement. Rest makes it better. Denies injury. Aleve has been helping. Pain wakes him sleep

## 2016-02-08 ENCOUNTER — Encounter (HOSPITAL_COMMUNITY): Payer: Self-pay | Admitting: Family Medicine

## 2016-02-08 ENCOUNTER — Ambulatory Visit (HOSPITAL_COMMUNITY)
Admission: EM | Admit: 2016-02-08 | Discharge: 2016-02-08 | Disposition: A | Discharge status: Home / Self Care | Payer: Medicaid Other | Attending: Family Medicine | Admitting: Family Medicine

## 2016-02-08 DIAGNOSIS — K529 Noninfective gastroenteritis and colitis, unspecified: Secondary | ICD-10-CM

## 2016-02-08 MED ORDER — ONDANSETRON HCL 4 MG PO TABS: 4.0000 mg | ORAL_TABLET | Freq: Four times a day (QID) | ORAL | 0 refills | Status: DC

## 2016-02-08 MED ORDER — ONDANSETRON 4 MG PO TBDP
ORAL_TABLET | ORAL | Status: AC
  Filled 2016-02-08: qty 2

## 2016-02-08 NOTE — ED Provider Notes (Signed)
[facility]    CSN: [id number] Arrival date & time: 02/08/16  1323  First Provider Contact:  First MD Initiated Contact with Patient 02/08/16 1451        History   Chief Complaint Chief Complaint  Patient presents with  . Abdominal Pain  . Emesis    HPI Sean Conway is a 20 y.o. male.   The history is provided by the patient.  Abdominal Pain  Pain location:  Periumbilical Pain quality: cramping   Pain radiates to:  Does not radiate Pain severity:  Mild Onset quality:  Sudden Duration:  1 day Progression:  Unchanged Chronicity:  New Relieved by:  None tried Worsened by:  Nothing Ineffective treatments:  None tried Associated symptoms: diarrhea, nausea and vomiting   Associated symptoms: no chills and no fever   Risk factors comment:  No sick contacts, possible food related Emesis  Associated symptoms: abdominal pain and diarrhea   Associated symptoms: no chills and no fever     Past Medical History:  Diagnosis Date  . Broken ankle   . Heart murmur   . History of broken finger     There are no active problems to display for this patient.   History reviewed. No pertinent surgical history.     Home Medications    Prior to Admission medications   Not on File    Family History History reviewed. No pertinent family history.  Social History Social History  Substance Use Topics  . Smoking status: Never Smoker  . Smokeless tobacco: Never Used  . Alcohol use No     Allergies   Review of patient's allergies indicates no known allergies.   Review of Systems Review of Systems  Constitutional: Negative.  Negative for chills and fever.  HENT: Negative.   Respiratory: Negative.   Gastrointestinal: Positive for abdominal pain, diarrhea, nausea and vomiting. Negative for blood in stool.  Genitourinary: Negative.   Musculoskeletal: Negative.   All other systems reviewed and are negative.    Physical Exam Triage Vital Signs ED  Triage Vitals  Enc Vitals Group     BP 02/08/16 1418 145/82     Pulse Rate 02/08/16 1418 60     Resp 02/08/16 1418 14     Temp 02/08/16 1418 97.3 F (36.3 C)     Temp Source 02/08/16 1418 Oral     SpO2 02/08/16 1418 100 %     Weight --      Height --      Head Circumference --      Peak Flow --      Pain Score 02/08/16 1426 10     Pain Loc --      Pain Edu? --      Excl. in GC? --    No data found.   Updated Vital Signs BP 145/82 (BP Location: Left Arm)   Pulse 60   Temp 97.3 F (36.3 C) (Oral)   Resp 14   SpO2 100%   Visual Acuity Right Eye Distance:   Left Eye Distance:   Bilateral Distance:    Right Eye Near:   Left Eye Near:    Bilateral Near:     Physical Exam  Constitutional: He is oriented to person, place, and time. He appears well-developed and well-nourished. No distress.  Pulmonary/Chest: Effort normal and breath sounds normal.  Abdominal: Soft. Bowel sounds are decreased. There is tenderness in the periumbilical area. There is no rigidity, no guarding and no CVA  tenderness.  Neurological: He is alert and oriented to person, place, and time.  Skin: Skin is warm and dry.  Nursing note and vitals reviewed.    UC Treatments / Results  Labs (all labs ordered are listed, but only abnormal results are displayed) Labs Reviewed - No data to display  EKG  EKG Interpretation None       Radiology No results found.  Procedures Procedures (including critical care time)  Medications Ordered in UC Medications - No data to display   Initial Impression / Assessment and Plan / UC Course  I have reviewed the triage vital signs and the nursing notes.  Pertinent labs & imaging results that were available during my care of the patient were reviewed by me and considered in my medical decision making (see chart for details).  Clinical Course      Final Clinical Impressions(s) / UC Diagnoses   Final diagnoses:  None    New Prescriptions New  Prescriptions   No medications on file     Linna Hoff, MD 02/08/16 1505

## 2016-02-08 NOTE — ED Triage Notes (Signed)
Pt here with generalized abd pain and N,V,D. sts started yesterday after eating some sausage. sts then he ate some grits and eggs this am. sts after he vomited.

## 2016-02-08 NOTE — Discharge Instructions (Signed)
Clear liquid diet tonight as tolerated, advance on tues as improved, use medicine as needed, return or see your doctor if any problems.  °

## 2016-05-02 IMAGING — CR DG CHEST 2V
2 series · 2 of 2 positions shown · non-contrast
Comparison: None.

CLINICAL DATA: Left-sided chest pain and cough for 3 days.
Shortness of breath for 1 day.

EXAM:
CHEST  2 VIEW

[chest pa]
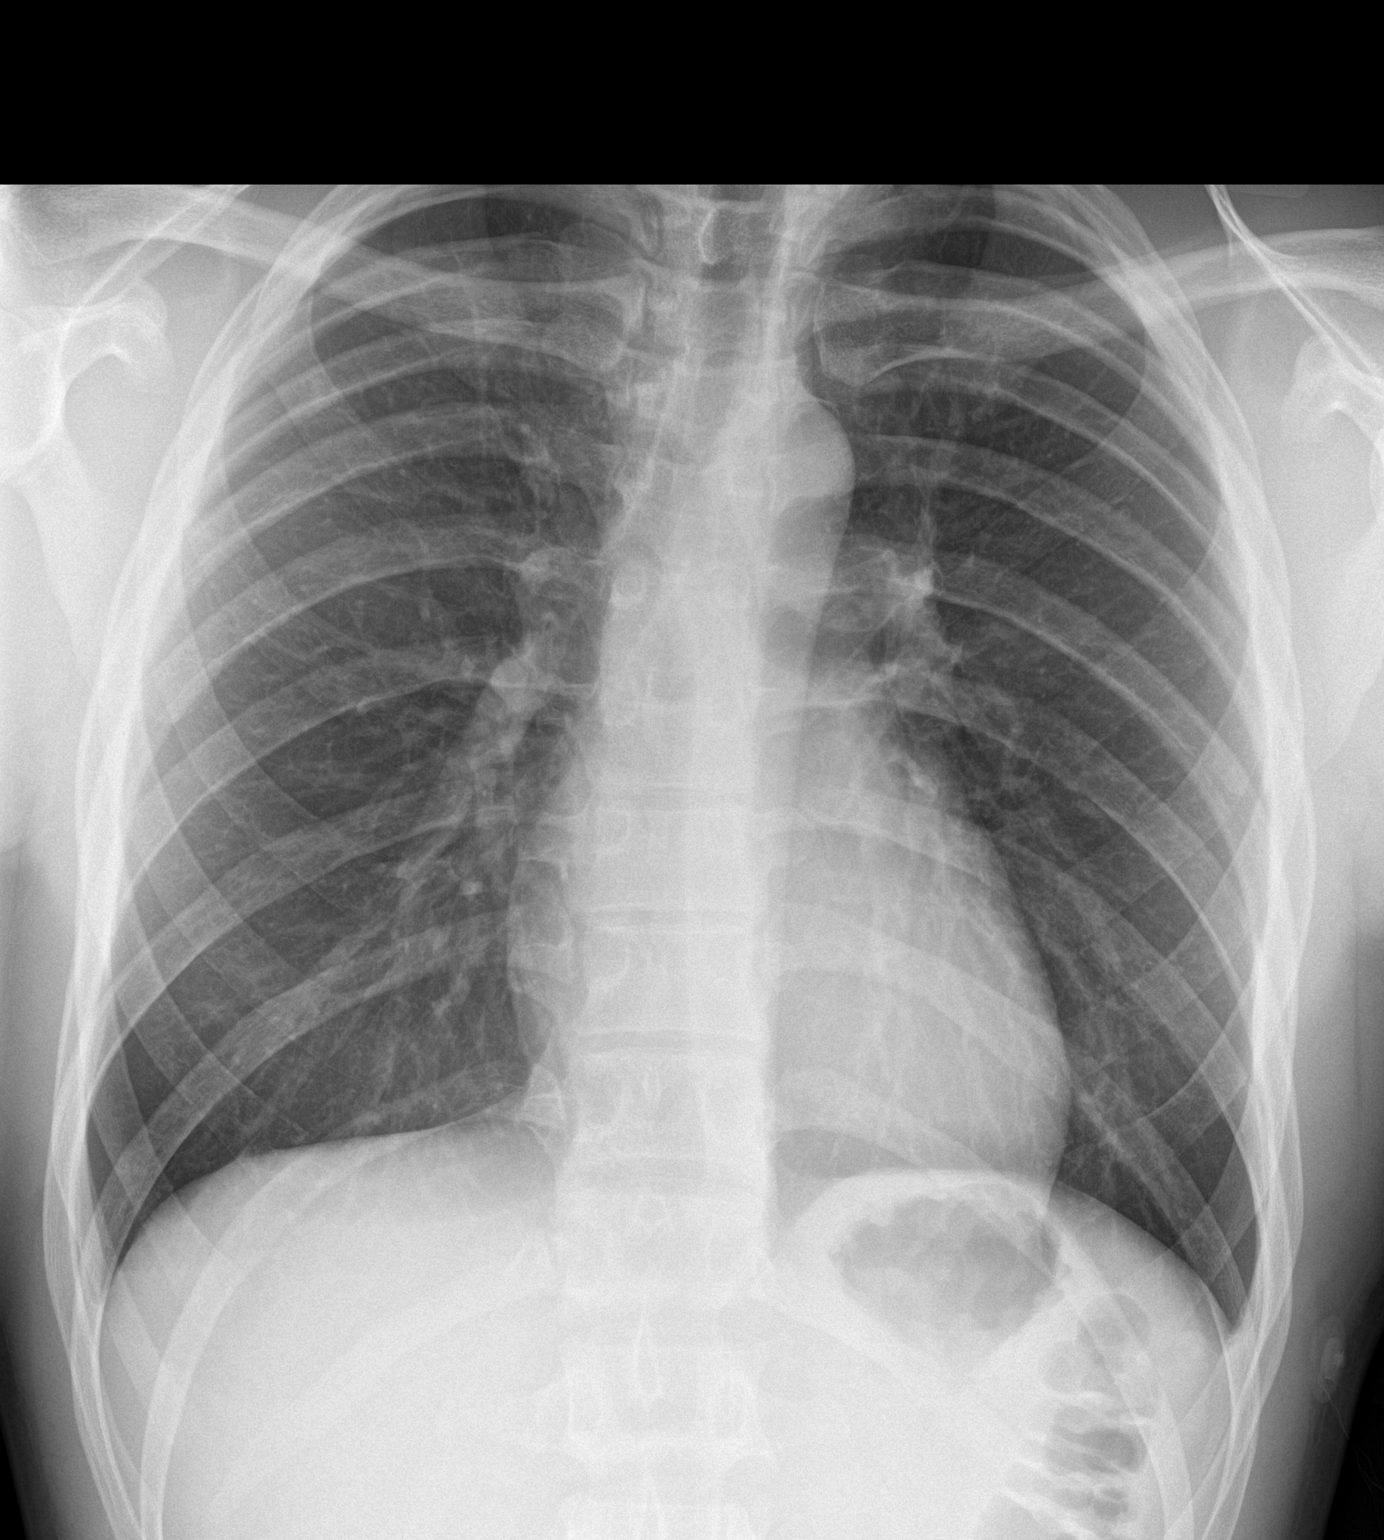

[chest lat]
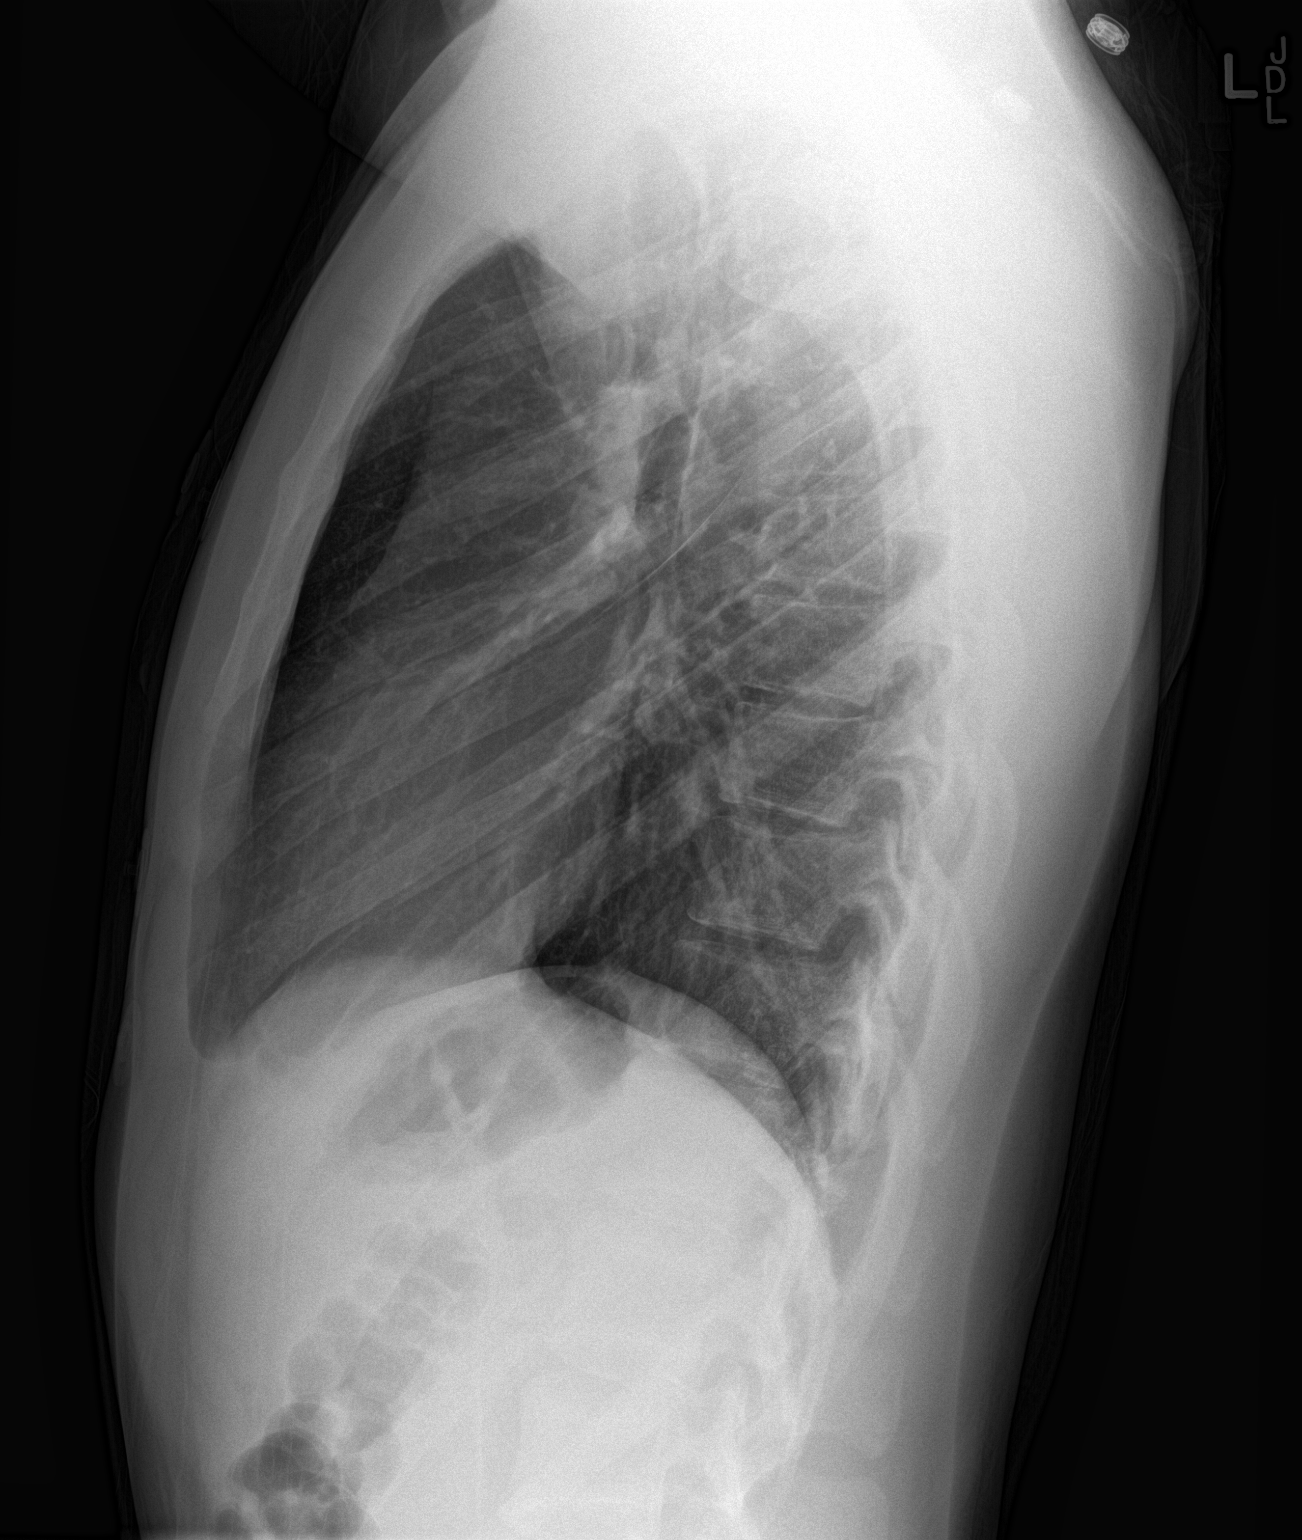

[2 of 2 positions shown; findings below may reference images not displayed]

FINDINGS: Heart size and mediastinal contours are within normal limits. Both
lungs are clear. Visualized skeletal structures are unremarkable.
IMPRESSION: Negative exam.

## 2017-01-23 ENCOUNTER — Encounter (HOSPITAL_COMMUNITY): Payer: Self-pay | Admitting: Emergency Medicine

## 2017-01-23 ENCOUNTER — Emergency Department (HOSPITAL_COMMUNITY)
Admission: EM | Admit: 2017-01-23 | Discharge: 2017-01-23 | Disposition: A | Discharge status: Home / Self Care | Payer: Medicaid Other | Attending: Emergency Medicine | Admitting: Emergency Medicine

## 2017-01-23 DIAGNOSIS — K625 Hemorrhage of anus and rectum: Secondary | ICD-10-CM

## 2017-01-23 DIAGNOSIS — I10 Essential (primary) hypertension: Secondary | ICD-10-CM | POA: Insufficient documentation

## 2017-01-23 DIAGNOSIS — N289 Disorder of kidney and ureter, unspecified: Secondary | ICD-10-CM

## 2017-01-23 LAB — COMPREHENSIVE METABOLIC PANEL
ALK PHOS: 36 U/L — AB (ref 38–126)
ALT: 18 U/L (ref 17–63)
AST: 37 U/L (ref 15–41)
Albumin: 4.5 g/dL (ref 3.5–5.0)
Anion gap: 7 (ref 5–15)
BUN: 7 mg/dL (ref 6–20)
CALCIUM: 9.7 mg/dL (ref 8.9–10.3)
CO2: 28 mmol/L (ref 22–32)
CREATININE: 1.42 mg/dL — AB (ref 0.61–1.24)
Chloride: 103 mmol/L (ref 101–111)
Glucose, Bld: 88 mg/dL (ref 65–99)
Potassium: 4.4 mmol/L (ref 3.5–5.1)
Sodium: 138 mmol/L (ref 135–145)
Total Bilirubin: 1 mg/dL (ref 0.3–1.2)
Total Protein: 8 g/dL (ref 6.5–8.1)

## 2017-01-23 LAB — LIPASE, BLOOD: Lipase: 41 U/L (ref 11–51)

## 2017-01-23 LAB — URINALYSIS, ROUTINE W REFLEX MICROSCOPIC
BILIRUBIN URINE: NEGATIVE
Bacteria, UA: NONE SEEN
Glucose, UA: NEGATIVE mg/dL
Hgb urine dipstick: NEGATIVE
Ketones, ur: NEGATIVE mg/dL
LEUKOCYTES UA: NEGATIVE
Nitrite: NEGATIVE
PH: 5 (ref 5.0–8.0)
Protein, ur: 100 mg/dL — AB
SPECIFIC GRAVITY, URINE: 1.017 (ref 1.005–1.030)

## 2017-01-23 LAB — CBC
HCT: 42.6 % (ref 39.0–52.0)
Hemoglobin: 14.2 g/dL (ref 13.0–17.0)
MCH: 32.3 pg (ref 26.0–34.0)
MCHC: 33.3 g/dL (ref 30.0–36.0)
MCV: 97 fL (ref 78.0–100.0)
PLATELETS: 197 10*3/uL (ref 150–400)
RBC: 4.39 MIL/uL (ref 4.22–5.81)
RDW: 12.3 % (ref 11.5–15.5)
WBC: 5.5 10*3/uL (ref 4.0–10.5)

## 2017-01-23 LAB — POC OCCULT BLOOD, ED: FECAL OCCULT BLD: NEGATIVE

## 2017-01-23 NOTE — ED Provider Notes (Signed)
[facility]-EMERGENCY DEPT Provider Note   CSN: [id number] Arrival date & time: 01/23/17  1221     History   Chief Complaint Chief Complaint  Patient presents with  . Abdominal Pain  . GI Bleeding    HPI Sean Conway is a 21 y.o. male.  HPI  21 year old male presents today stating that he has noted blood in his stool twice over the past 2 days. He states he is having some looser stool than usual. He is having 2 bowel movements per day. He denies nausea, vomiting, diarrhea, or abdominal pain. He states he had a similar episode last year and was told he had a hemorrhoid. He has not noted any masses or hemorrhoids since that time. He denies any lightheadedness. He has not had fever, vomiting, chills, or diarrhea. He is not on any blood thinners. He denies alcohol use.  Past Medical History:  Diagnosis Date  . Broken ankle   . Heart murmur   . History of broken finger     There are no active problems to display for this patient.   History reviewed. No pertinent surgical history.     Home Medications    Prior to Admission medications   Medication Sig Start Date End Date Taking? Authorizing Provider  ondansetron (ZOFRAN) 4 MG tablet Take 1 tablet (4 mg total) by mouth every 6 (six) hours. Prn n/v. 02/08/16   Linna Hoff, MD    Family History History reviewed. No pertinent family history.  Social History Social History  Substance Use Topics  . Smoking status: Never Smoker  . Smokeless tobacco: Never Used  . Alcohol use No     Allergies   Patient has no known allergies.   Review of Systems Review of Systems  All other systems reviewed and are negative.    Physical Exam Updated Vital Signs BP (!) 138/93 (BP Location: Right Arm)   Pulse 81   Temp 98.5 F (36.9 C) (Oral)   Resp 18   SpO2 99%   Physical Exam  Constitutional: He is oriented to person, place, and time. He appears well-developed and well-nourished. No distress.  HENT:  Head: Normocephalic  and atraumatic.  Eyes: Pupils are equal, round, and reactive to light. EOM are normal.  Neck: Normal range of motion. Neck supple.  Cardiovascular: Normal rate and regular rhythm.   Pulmonary/Chest: Effort normal and breath sounds normal.  Abdominal: Soft. Bowel sounds are normal.  Musculoskeletal: Normal range of motion.  Neurological: He is alert and oriented to person, place, and time.  Skin: Skin is warm. Capillary refill takes less than 2 seconds.  Psychiatric: He has a normal mood and affect.  Nursing note and vitals reviewed.    ED Treatments / Results  Labs (all labs ordered are listed, but only abnormal results are displayed) Labs Reviewed  COMPREHENSIVE METABOLIC PANEL - Abnormal; Notable for the following:       Result Value   Creatinine, Ser 1.42 (*)    Alkaline Phosphatase 36 (*)    All other components within normal limits  URINALYSIS, ROUTINE W REFLEX MICROSCOPIC - Abnormal; Notable for the following:    Protein, ur 100 (*)    Squamous Epithelial / LPF 0-5 (*)    All other components within normal limits  LIPASE, BLOOD  CBC  OCCULT BLOOD X 1 CARD TO LAB, STOOL  POC OCCULT BLOOD, ED    EKG  EKG Interpretation None       Radiology No results found.  Procedures Procedures (including critical care time)  Medications Ordered in ED Medications - No data to display   Initial Impression / Assessment and Plan / ED Course  I have reviewed the triage vital signs and the nursing notes.  Pertinent labs & imaging results that were available during my care of the patient were reviewed by me and considered in my medical decision making (see chart for details).    1- rectal bleeding- no blood or abnormality seen on exam.  Hemocult 2- renal insufficiency- stable from prior- patient advised re need for follow up and hypertension management.  3- hypertension Final Clinical Impressions(s) / ED Diagnoses   Final diagnoses:  Rectal bleeding  Renal insufficiency    Hypertension, unspecified type    New Prescriptions New Prescriptions   No medications on file     Margarita Grizzle, MD 01/23/17 1649

## 2017-01-23 NOTE — Discharge Instructions (Signed)
Please follow up with a primary care doctor to follow your blood pressure and kidney function.  Your creatinine is 1.42 today and your blood pressure is elevated at 135/82.  No evidence of blood was found in your stool.  Please return if you have worsening bleeding, weakness, or pain.

## 2017-01-23 NOTE — ED Triage Notes (Signed)
Pt to ER for eval of RLQ abd pain and two days of "blood in stool." unable to state whether the blood is bright or not, also unable to state if only present when he wipes or if it's also in the stool. Pt in NAD at present. Denies nausea, vomiting, reports "looser stool than normal."

## 2017-04-07 ENCOUNTER — Encounter (HOSPITAL_COMMUNITY): Payer: Self-pay | Admitting: Emergency Medicine

## 2017-04-07 ENCOUNTER — Emergency Department (HOSPITAL_COMMUNITY): Payer: Self-pay

## 2017-04-07 ENCOUNTER — Emergency Department (HOSPITAL_COMMUNITY)
Admission: EM | Admit: 2017-04-07 | Discharge: 2017-04-07 | Disposition: A | Discharge status: Home / Self Care | Payer: Self-pay | Attending: Emergency Medicine | Admitting: Emergency Medicine

## 2017-04-07 DIAGNOSIS — M25511 Pain in right shoulder: Secondary | ICD-10-CM | POA: Insufficient documentation

## 2017-04-07 MED ORDER — ACETAMINOPHEN 500 MG PO TABS
1000.0000 mg | ORAL_TABLET | Freq: Once | ORAL | Status: AC
  Administered 2017-04-07: 1000 mg via ORAL
  Filled 2017-04-07: qty 2

## 2017-04-07 NOTE — ED Provider Notes (Signed)
[facility]   CSN: [id number] Arrival date & time: 04/07/17  1055     History   Chief Complaint Chief Complaint  Patient presents with  . Shoulder Pain    HPI Sean Conway is a 21 y.o. male who presents with right shoulder pain.  Patient reports that he has a history of right shoulder dislocations.  He reports that this occurred when he was a teenager and he has not had any dislocations for approximately 2-3 years.  Patient reports that yesterday while at work, he had a shoulder dislocation.  He was able to put it back into place without any difficulty.  He reports that yesterday when he was at home, he was wrestling around with his brothers which caused a another dislocation.  Again patient was easily able to get the shoulder back into place without any difficulty.  He has not taken any Tylenol or ibuprofen for pain.  He has not tried any additional therapies at home.  Patient comes in today for persistent right shoulder pain.  He does not feel like it is dislocated on ED arrival.  He denies any other preceding trauma, injury, fall.  Patient reports that his pain is worsened with range of motion of the right shoulder.  Patient denies any swelling, numbness/weakness of the arm.  The history is provided by the patient.    Past Medical History:  Diagnosis Date  . Broken ankle   . Heart murmur   . History of broken finger     There are no active problems to display for this patient.   History reviewed. No pertinent surgical history.     Home Medications    Prior to Admission medications   Medication Sig Start Date End Date Taking? Authorizing Provider  ondansetron (ZOFRAN) 4 MG tablet Take 1 tablet (4 mg total) by mouth every 6 (six) hours. Prn n/v. 02/08/16   Linna HoffKindl, James D, MD    Family History No family history on file.  Social History Social History   Tobacco Use  . Smoking status: Never Smoker  . Smokeless  tobacco: Never Used  Substance Use Topics  . Alcohol use: No  . Drug use: No     Allergies   Patient has no known allergies.   Review of Systems Review of Systems  Musculoskeletal:       Right shoulder pain  Skin: Negative for color change.  Neurological: Negative for weakness and numbness.     Physical Exam Updated Vital Signs Pulse 99   Temp 98 F (36.7 C) (Oral)   Resp 16   SpO2 100%   Physical Exam  Constitutional: He appears well-developed and well-nourished.  Sitting comfortably on examination table.   HENT:  Head: Normocephalic and atraumatic.  Eyes: Conjunctivae and EOM are normal. Right eye exhibits no discharge. Left eye exhibits no discharge. No scleral icterus.  Cardiovascular:  Pulses:      Radial pulses are 2+ on the right side, and 2+ on the left side.  Pulmonary/Chest: Effort normal.  Musculoskeletal:  Tenderness palpation overlying the anterior aspect of the right shoulder.  No deformity or crepitus noted.  No overlying warmth, erythema, ecchymosis.  Limited range of motion of right shoulder secondary to patient's pain. No abnormalities of the right elbow, right wrist.  Full range of motion of right elbow and right wrist without any difficulty.  No abnormalities of the left upper extremity.  Neurological: He is alert.  Sensation  intact along major nerve distributions of BUE Equal grip strength bilaterally 5 out of 5 strength bilateral upper extremities.  Skin: Skin is warm and dry.  Psychiatric: He has a normal mood and affect. His speech is normal and behavior is normal.  Nursing note and vitals reviewed.    ED Treatments / Results  Labs (all labs ordered are listed, but only abnormal results are displayed) Labs Reviewed - No data to display  EKG  EKG Interpretation None       Radiology Dg Shoulder Right  Result Date: 04/07/2017 CLINICAL DATA:  Felt pop when region with right arm. Possible dislocation. EXAM: RIGHT SHOULDER - 2+ VIEW  COMPARISON:  None. FINDINGS: There is no evidence of fracture or dislocation. There is no evidence of arthropathy or other focal bone abnormality. Soft tissues are unremarkable. IMPRESSION: Negative. Electronically Signed   By: Elberta Fortisaniel  Boyle M.D.   On: 04/07/2017 12:39    Procedures Procedures (including critical care time)  Medications Ordered in ED Medications  acetaminophen (TYLENOL) tablet 1,000 mg (1,000 mg Oral Given 04/07/17 1323)     Initial Impression / Assessment and Plan / ED Course  I have reviewed the triage vital signs and the nursing notes.  Pertinent labs & imaging results that were available during my care of the patient were reviewed by me and considered in my medical decision making (see chart for details).     21 year old male presents with right shoulder pain.  He reports a history of right shoulder dislocations and states that he had 2 episodes of dislocations yesterday.  He was able to easily get the shoulder back into place without any difficulty.  He has not taken any medications for pain.  He reports no deformity or feeling like it was dislocated in the emergency department today. Patient is afebrile, non-toxic appearing, sitting comfortably on examination table. Vital signs reviewed and stable. Patient is neurovascularly intact.  Physical exam shows some tenderness to palpation the anterior aspect of the right shoulder.  No evidence of deformity.  Consider right shoulder pain versus strain.  History/physical exam not concerning for recurrent shoulder dislocation or fracture.  History/physical exam not concerning for septic arthritis.  Given history, will obtain x-ray imaging for further evaluation. Analgesics provided in the department.  X-rays reviewed.  Negative for any acute fracture or dislocation.  Symptoms are likely result of muscle strain secondary to dislocation that occurred yesterday.  Discussed results with patient.  Will provide sling support in the patient  agreed to follow-up with orthopedics.  NSAIDs use for pain relief.  Encourage conservative home therapies. Plan to provide outpatient orthopedic referral for further follow-up and evaluation. Strict return precautions discussed. Patient expresses understanding and agreement to plan.    Final Clinical Impressions(s) / ED Diagnoses   Final diagnoses:  Acute pain of right shoulder    ED Discharge Orders    None       Maxwell CaulLayden, Elber Galyean A, PA-C 04/07/17 1332    Pricilla LovelessGoldston, Scott, MD 04/08/17 1954

## 2017-04-07 NOTE — ED Triage Notes (Signed)
Pt states he has history of dislocated shoulder  In high school. Yesterday while at work felt like it happened again was able to pop in back in but still having pain.

## 2017-04-07 NOTE — Discharge Instructions (Signed)
As we discussed, the shoulder will likely be sore for the next few days.  You can take Tylenol or Ibuprofen as directed for pain. You can alternate Tylenol and Ibuprofen every 4 hours. If you take Tylenol at 1pm, then you can take Ibuprofen at 5pm. Then you can take Tylenol again at 9pm.   Wear Sling for support and stable lesion for the next few days.  Follow-up with referred orthopedic doctor for further evaluation.  Return the emergency department for any worsening pain, redness or swelling of the shoulder, fevers, numbness/weakness or any other worsening symptoms.

## 2017-09-12 ENCOUNTER — Emergency Department (HOSPITAL_COMMUNITY)
Admission: EM | Admit: 2017-09-12 | Discharge: 2017-09-12 | Disposition: A | Discharge status: Home / Self Care | Payer: Self-pay | Attending: Emergency Medicine | Admitting: Emergency Medicine

## 2017-09-12 ENCOUNTER — Emergency Department (HOSPITAL_COMMUNITY): Payer: Self-pay

## 2017-09-12 ENCOUNTER — Other Ambulatory Visit: Payer: Self-pay

## 2017-09-12 ENCOUNTER — Encounter (HOSPITAL_COMMUNITY): Payer: Self-pay | Admitting: Emergency Medicine

## 2017-09-12 DIAGNOSIS — S82391A Other fracture of lower end of right tibia, initial encounter for closed fracture: Secondary | ICD-10-CM | POA: Insufficient documentation

## 2017-09-12 DIAGNOSIS — Y929 Unspecified place or not applicable: Secondary | ICD-10-CM | POA: Insufficient documentation

## 2017-09-12 DIAGNOSIS — Y999 Unspecified external cause status: Secondary | ICD-10-CM | POA: Insufficient documentation

## 2017-09-12 DIAGNOSIS — W010XXA Fall on same level from slipping, tripping and stumbling without subsequent striking against object, initial encounter: Secondary | ICD-10-CM | POA: Insufficient documentation

## 2017-09-12 DIAGNOSIS — Y9301 Activity, walking, marching and hiking: Secondary | ICD-10-CM | POA: Insufficient documentation

## 2017-09-12 MED ORDER — HYDROCODONE-ACETAMINOPHEN 5-325 MG PO TABS
1.0000 | ORAL_TABLET | Freq: Once | ORAL | Status: AC
  Administered 2017-09-12: 1 via ORAL
  Filled 2017-09-12: qty 1

## 2017-09-12 NOTE — ED Triage Notes (Signed)
Patient tripped on sidewalk and twisted ankle. Swelling to right ankle. Fractured same ankle in 2014. Reports pain today feels similar to then. Denies LOC.

## 2017-09-12 NOTE — ED Provider Notes (Signed)
[facility]   CSN: [id number] Arrival date & time: 09/12/17  1228     History   Chief Complaint Chief Complaint  Patient presents with  . Ankle Pain    HPI Sean Conway is a 22 y.o. male who presents for evaluation of right ankle pain that began this morning after mechanical fall.  Patient reports he is walking when he tripped, causing a inversion injury of his right ankle.  Patient reports he was able to get up and slightly put some weight on the ankle but had worsening pain.  Patient reports that he has a history of fracture to that ankle and states that it feels similar today.  Patient reports pain worsened with movement.  Patient reports he did not take any medications for pain.  He denies any head injury, LOC.  Patient denies any numbness/weakness.  The history is provided by the patient.    Past Medical History:  Diagnosis Date  . Broken ankle   . Heart murmur   . History of broken finger     There are no active problems to display for this patient.   History reviewed. No pertinent surgical history.      Home Medications    Prior to Admission medications   Medication Sig Start Date End Date Taking? Authorizing Provider  ondansetron (ZOFRAN) 4 MG tablet Take 1 tablet (4 mg total) by mouth every 6 (six) hours. Prn n/v. 02/08/16   Linna HoffKindl, James D, MD    Family History No family history on file.  Social History Social History   Tobacco Use  . Smoking status: Never Smoker  . Smokeless tobacco: Never Used  Substance Use Topics  . Alcohol use: No  . Drug use: No     Allergies   Patient has no known allergies.   Review of Systems Review of Systems  Musculoskeletal:       Ankle pain  Neurological: Negative for weakness and numbness.     Physical Exam Updated Vital Signs BP 137/86 (BP Location: Right Arm)   Pulse 67   Temp 98.2 F (36.8 C) (Oral)   Resp 16   SpO2 99%   Physical Exam    Constitutional: He appears well-developed and well-nourished.  HENT:  Head: Normocephalic and atraumatic.  Eyes: EOM are normal.  Neck: Normal range of motion.  Cardiovascular:  Pulses:      Dorsalis pedis pulses are 2+ on the right side, and 2+ on the left side.  Pulmonary/Chest: Effort normal.  Musculoskeletal:  Tenderness to palpation to the lateral malleolus and extends to the anterior ankle of the right ankle with overlying soft tissue swelling.  No verlying ecchymosis, warmth, or erythema. No deformity or crepitus noted.  Dorsiflexion and plantarflexion intact but with subjective reports of pain.  Patient is able to move all 5 digits of right foot without difficulty.  No tenderness palpation to mid or proximal tib-fib.  No tenderness palpation to knee.  No abnormalities of the left lower extremity.  Neurological:  Sensation intact throughout all major nerve distributions of the feet   Skin: Skin is warm and dry. Capillary refill takes less than 2 seconds.  The skin is intact to ankle/foot.  The foot is warm and well perfused with intact sensation  Nursing note and vitals reviewed.    ED Treatments / Results  Labs (all labs ordered are listed, but only abnormal results are displayed) Labs Reviewed - No data to display  EKG None  Radiology Dg Ankle Complete Right  Result Date: 09/12/2017 CLINICAL DATA:  Swelling after injury EXAM: RIGHT ANKLE - COMPLETE 3+ VIEW COMPARISON:  10/23/2011 FINDINGS: Extensive soft tissue swelling laterally. On the lateral view, there appears to be a small avulsion fracture of the distal tibia anteriorly extending into the joint. Joint effusion. Healed fracture of the fibula since the prior study. IMPRESSION: Probable nondisplaced fracture distal tibia with lateral soft tissue swelling. Electronically Signed   By: Marlan Palau M.D.   On: 09/12/2017 13:32    Procedures Procedures (including critical care time)  Medications Ordered in  ED Medications  HYDROcodone-acetaminophen (NORCO/VICODIN) 5-325 MG per tablet 1 tablet (1 tablet Oral Given 09/12/17 1427)     Initial Impression / Assessment and Plan / ED Course  I have reviewed the triage vital signs and the nursing notes.  Pertinent labs & imaging results that were available during my care of the patient were reviewed by me and considered in my medical decision making (see chart for details).     22 year old male who presents for evaluation of right ankle pain after mechanical fall earlier this morning.  No head injury, LOC.  Was able to get up and slightly put weight on foot but states worsening pain.  Has a history of fracture to the same ankle. Patient is afebrile, non-toxic appearing, sitting comfortably on examination table. Vital signs reviewed and stable. Patient is neurovascularly intact.  On exam, patient has tenderness palpation of the lateral aspect of the right ankle.  Dorsiflexion plantarflexion intact without difficulty.  Consider sprain versus strain versus fracture versus dislocation.  X-rays ordered at triage.  X-rays reviewed.  There appears to be a small avulsion fracture of the distal tibia.  Discussed results with patient.  We will plan to put and short leg splint.  Patient will be provided with outpatient orthopedic referral for further evaluation. Patient had ample opportunity for questions and discussion. All patient's questions were answered with full understanding. Strict return precautions discussed. Patient expresses understanding and agreement to plan.   Final Clinical Impressions(s) / ED Diagnoses   Final diagnoses:  Other closed fracture of distal end of right tibia, initial encounter    ED Discharge Orders    None       Maxwell Caul, PA-C 09/12/17 1459    Cathren Laine, MD 09/13/17 1257

## 2017-09-12 NOTE — Progress Notes (Signed)
Orthopedic Tech Progress Note Patient Details:  Cecilie KicksRaekwon S Yannuzzi 11/04/1995 [id number]  Ortho Devices Type of Ortho Device: Ace wrap, Short leg splint Ortho Device/Splint Interventions: Application   Post Interventions Patient Tolerated: Well Instructions Provided: Care of device   Saul FordyceJennifer C Schneider Warchol 09/12/2017, 2:02 PM

## 2017-09-12 NOTE — ED Notes (Signed)
Patient transported to X-ray 

## 2017-09-12 NOTE — Discharge Instructions (Signed)
You can take Tylenol or Ibuprofen as directed for pain. You can alternate Tylenol and Ibuprofen every 4 hours. If you take Tylenol at 1pm, then you can take Ibuprofen at 5pm. Then you can take Tylenol again at 9pm.   Follow the RICE (Rest, Ice, Compression, Elevation) protocol as directed.   Follow-up with referred orthopedic doctor.   Do not get your splint wet.  Return to the emergency department for any worsening pain, discoloration of the toes, redness or swelling of the foot, fever or any other worsening or concerning symptoms.

## 2017-09-21 ENCOUNTER — Emergency Department (HOSPITAL_COMMUNITY)
Admission: EM | Admit: 2017-09-21 | Discharge: 2017-09-21 | Disposition: A | Discharge status: Home / Self Care | Payer: Self-pay | Attending: Emergency Medicine | Admitting: Emergency Medicine

## 2017-09-21 ENCOUNTER — Other Ambulatory Visit: Payer: Self-pay

## 2017-09-21 ENCOUNTER — Encounter (HOSPITAL_COMMUNITY): Payer: Self-pay | Admitting: Emergency Medicine

## 2017-09-21 ENCOUNTER — Emergency Department (HOSPITAL_COMMUNITY): Payer: Self-pay

## 2017-09-21 DIAGNOSIS — Y929 Unspecified place or not applicable: Secondary | ICD-10-CM | POA: Insufficient documentation

## 2017-09-21 DIAGNOSIS — Y9389 Activity, other specified: Secondary | ICD-10-CM | POA: Insufficient documentation

## 2017-09-21 DIAGNOSIS — Y999 Unspecified external cause status: Secondary | ICD-10-CM | POA: Insufficient documentation

## 2017-09-21 DIAGNOSIS — Z23 Encounter for immunization: Secondary | ICD-10-CM | POA: Insufficient documentation

## 2017-09-21 DIAGNOSIS — W25XXXA Contact with sharp glass, initial encounter: Secondary | ICD-10-CM | POA: Insufficient documentation

## 2017-09-21 DIAGNOSIS — S61511A Laceration without foreign body of right wrist, initial encounter: Secondary | ICD-10-CM | POA: Insufficient documentation

## 2017-09-21 DIAGNOSIS — F17290 Nicotine dependence, other tobacco product, uncomplicated: Secondary | ICD-10-CM | POA: Insufficient documentation

## 2017-09-21 MED ORDER — HYDROCODONE-ACETAMINOPHEN 5-325 MG PO TABS: 1.0000 | ORAL_TABLET | Freq: Four times a day (QID) | ORAL | 0 refills | Status: DC | PRN

## 2017-09-21 MED ORDER — IBUPROFEN 800 MG PO TABS
800.0000 mg | ORAL_TABLET | Freq: Once | ORAL | Status: AC
  Administered 2017-09-21: 800 mg via ORAL
  Filled 2017-09-21: qty 1

## 2017-09-21 MED ORDER — BACITRACIN ZINC 500 UNIT/GM EX OINT: 1.0000 "application " | TOPICAL_OINTMENT | Freq: Two times a day (BID) | CUTANEOUS | 0 refills | Status: DC

## 2017-09-21 MED ORDER — LIDOCAINE-EPINEPHRINE (PF) 2 %-1:200000 IJ SOLN
20.0000 mL | Freq: Once | INTRAMUSCULAR | Status: AC
  Administered 2017-09-21: 20 mL
  Filled 2017-09-21: qty 20

## 2017-09-21 NOTE — ED Notes (Signed)
Pt being sutured

## 2017-09-21 NOTE — ED Provider Notes (Signed)
[facility]-EMERGENCY DEPT Provider Note   CSN: [id number] Arrival date & time: 09/21/17  0006     History   Chief Complaint Chief Complaint  Patient presents with  . Extremity Laceration    HPI Sean Conway is a 22 y.o. male.   22 year old male, right-hand-dominant, presents to the emergency department for evaluation of laceration after he punched a window prior to arrival.  He has had constant pain in the area with no medications taken prior to arrival.  This is aggravated with palpation.  No associated numbness or weakness.  Last tetanus shot was 1 year ago.  Bleeding controlled with pressure.     Past Medical History:  Diagnosis Date  . Broken ankle   . Heart murmur   . History of broken finger     There are no active problems to display for this patient.   Past Surgical History:  Procedure Laterality Date  . extraction of wisdom teeth          Home Medications    Prior to Admission medications   Medication Sig Start Date End Date Taking? Authorizing Provider  bacitracin ointment Apply 1 application topically 2 (two) times daily. Apply to your wound as prescribed 09/21/17   Antony Madura, PA-C  HYDROcodone-acetaminophen (NORCO/VICODIN) 5-325 MG tablet Take 1 tablet by mouth every 6 (six) hours as needed. 09/21/17   Antony Madura, PA-C  ondansetron (ZOFRAN) 4 MG tablet Take 1 tablet (4 mg total) by mouth every 6 (six) hours. Prn n/v. 02/08/16   Linna Hoff, MD    Family History Family History  Problem Relation Age of Onset  . Diabetes Other     Social History Social History   Tobacco Use  . Smoking status: Current Some Day Smoker    Types: Cigars  . Smokeless tobacco: Never Used  Substance Use Topics  . Alcohol use: Yes    Comment: occ  . Drug use: No     Allergies   Patient has no known allergies.   Review of Systems Review of Systems Ten systems reviewed and are negative for acute change, except as noted in the  HPI.    Physical Exam Updated Vital Signs BP (!) 131/97 (BP Location: Left Arm)   Pulse 83   Temp 98.5 F (36.9 C) (Oral)   Resp 16   Ht 6\' 2"  (1.88 m)   Wt 86.2 kg (190 lb)   SpO2 94%   BMI 24.39 kg/m   Physical Exam  Constitutional: He is oriented to person, place, and time. He appears well-developed and well-nourished. No distress.  Nontoxic appearing and in NAD  HENT:  Head: Normocephalic and atraumatic.  Eyes: Conjunctivae and EOM are normal. No scleral icterus.  Neck: Normal range of motion.  Cardiovascular: Normal rate, regular rhythm and intact distal pulses.  Distal radial pulse 2+ in the RUE  Pulmonary/Chest: Effort normal. No respiratory distress.  Respirations even and unlabored  Musculoskeletal: Normal range of motion.       Right wrist: He exhibits laceration.       Arms: Neurological: He is alert and oriented to person, place, and time. He exhibits normal muscle tone. Coordination normal.  Sensation to light touch intact. Normal ROM of the R hand.  Skin: Skin is warm and dry. No rash noted. He is not diaphoretic. No erythema. No pallor.  Psychiatric: He has a normal mood and affect. His behavior is normal.  Nursing note and vitals reviewed.  ED Treatments / Results  Labs (all labs ordered are listed, but only abnormal results are displayed) Labs Reviewed - No data to display  EKG None  Radiology Dg Wrist Complete Right  Result Date: 09/21/2017 CLINICAL DATA:  Patient punched a window this pm and lacerated wrist, laceration anterior surface of wrist, patient has pressure bandage applied EXAM: RIGHT WRIST - COMPLETE 3+ VIEW COMPARISON:  Right wrist 09/16/2013.  Right hand 11/18/2014 FINDINGS: Old healed fracture deformity of the proximal right fifth metacarpal bone. No acute fracture or dislocation. No focal bone lesion or bone destruction. Bone cortex appears intact. Tiny radiopaque foreign body demonstrated in the soft tissues lateral to the distal  radial shaft. This may represent a soft tissue foreign body or foreign material in the overlying gauze material. IMPRESSION: No acute fracture or dislocation. Old fracture deformity of the fifth metacarpal bone. Radiopaque foreign body in or over the soft tissues adjacent to the distal radius. Electronically Signed   By: Burman NievesWilliam  Stevens M.D.   On: 09/21/2017 02:32    3:26 AM Imaging results reviewed with respect to the patient's injuries.  Foreign body appears to be associated with punctate laceration site.  No foreign bodies palpated.  Wound explored following analgesia.  No visual or palpated foreign bodies in 3 cm laceration of the right wrist.  Wound was copiously irrigated prior to closure.   Procedures Procedures (including critical care time)  LACERATION REPAIR Performed by: Antony MaduraKelly Monterio Bob Authorized by: Antony MaduraKelly Mike Berntsen Consent: Verbal consent obtained. Risks and benefits: risks, benefits and alternatives were discussed Consent given by: patient Patient identity confirmed: provided demographic data Prepped and Draped in normal sterile fashion Wound explored  Laceration Location: volar R wrist  Laceration Length: 3cm  No Foreign Bodies seen or palpated  Anesthesia: local infiltration  Local anesthetic: lidocaine 2% with epinephrine  Anesthetic total: 4 ml  Irrigation method: syringe Amount of cleaning: standard  Skin closure: 4-0 prolene  Number of sutures: 4  Technique: horizontal mattress (3) simple interrupted (1)  Patient tolerance: Patient tolerated the procedure well with no immediate complications.   Medications Ordered in ED Medications  ibuprofen (ADVIL,MOTRIN) tablet 800 mg (800 mg Oral Given 09/21/17 0222)  lidocaine-EPINEPHrine (XYLOCAINE W/EPI) 2 %-1:200000 (PF) injection 20 mL (20 mLs Infiltration Given 09/21/17 0222)     Initial Impression / Assessment and Plan / ED Course  I have reviewed the triage vital signs and the nursing notes.  Pertinent labs  & imaging results that were available during my care of the patient were reviewed by me and considered in my medical decision making (see chart for details).     Tdap booster UTD. Pressure irrigation performed. Laceration occurred < 8 hours prior to repair which was well tolerated. Pt has no comorbidities to effect normal wound healing. Discussed suture home care with pt and answered questions. Pt to follow up for wound check and suture removal in 10-12 days. Pt is hemodynamically stable with no complaints prior to discharge.     Final Clinical Impressions(s) / ED Diagnoses   Final diagnoses:  Laceration of right wrist, initial encounter    ED Discharge Orders        Ordered    bacitracin ointment  2 times daily     09/21/17 0322    HYDROcodone-acetaminophen (NORCO/VICODIN) 5-325 MG tablet  Every 6 hours PRN     09/21/17 0322       Antony MaduraHumes, Wilmarie Sparlin, PA-C 09/21/17 0327    Ward, Layla MawKristen N, DO 09/21/17 [id number]
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
## 2018-12-14 ENCOUNTER — Other Ambulatory Visit: Payer: Self-pay

## 2018-12-14 ENCOUNTER — Emergency Department (HOSPITAL_COMMUNITY): Payer: Self-pay

## 2018-12-14 ENCOUNTER — Encounter (HOSPITAL_COMMUNITY): Payer: Self-pay | Admitting: Emergency Medicine

## 2018-12-14 ENCOUNTER — Emergency Department (HOSPITAL_COMMUNITY)
Admission: EM | Admit: 2018-12-14 | Discharge: 2018-12-14 | Disposition: A | Discharge status: Home / Self Care | Payer: Self-pay | Attending: Emergency Medicine | Admitting: Emergency Medicine

## 2018-12-14 DIAGNOSIS — F17290 Nicotine dependence, other tobacco product, uncomplicated: Secondary | ICD-10-CM | POA: Insufficient documentation

## 2018-12-14 DIAGNOSIS — Y929 Unspecified place or not applicable: Secondary | ICD-10-CM | POA: Insufficient documentation

## 2018-12-14 DIAGNOSIS — Y9389 Activity, other specified: Secondary | ICD-10-CM | POA: Insufficient documentation

## 2018-12-14 DIAGNOSIS — M25511 Pain in right shoulder: Secondary | ICD-10-CM | POA: Insufficient documentation

## 2018-12-14 DIAGNOSIS — Y998 Other external cause status: Secondary | ICD-10-CM | POA: Insufficient documentation

## 2018-12-14 DIAGNOSIS — M25571 Pain in right ankle and joints of right foot: Secondary | ICD-10-CM | POA: Insufficient documentation

## 2018-12-14 DIAGNOSIS — X501XXA Overexertion from prolonged static or awkward postures, initial encounter: Secondary | ICD-10-CM | POA: Insufficient documentation

## 2018-12-14 MED ORDER — IBUPROFEN 800 MG PO TABS
800.0000 mg | ORAL_TABLET | Freq: Once | ORAL | Status: AC
  Administered 2018-12-14: 800 mg via ORAL
  Filled 2018-12-14: qty 1

## 2018-12-14 NOTE — Discharge Instructions (Addendum)
Take ibuprofen 3 times a day with meals.  Do not take other anti-inflammatories at the same time open (Advil, Motrin, naproxen, Aleve). You may supplement with Tylenol if you need further pain control. Use ice packs to help control your pain. Use the sling for the next week to prevent re-dislocation.  Use the aso for pain control and support.  Follow up with orthopedics as needed for further evaluation.  Return to the ER with any new, worsening, or concerning symptoms.
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
## 2019-09-24 ENCOUNTER — Ambulatory Visit (HOSPITAL_COMMUNITY)
Admission: EM | Admit: 2019-09-24 | Discharge: 2019-09-24 | Disposition: A | Discharge status: Home / Self Care | Payer: Medicaid Other | Attending: Family Medicine | Admitting: Family Medicine

## 2019-09-24 ENCOUNTER — Other Ambulatory Visit: Payer: Self-pay

## 2019-09-24 DIAGNOSIS — M25511 Pain in right shoulder: Secondary | ICD-10-CM

## 2019-09-24 DIAGNOSIS — M24411 Recurrent dislocation, right shoulder: Secondary | ICD-10-CM

## 2019-09-24 MED ORDER — HYDROCODONE-ACETAMINOPHEN 7.5-325 MG PO TABS: 1.0000 | ORAL_TABLET | Freq: Four times a day (QID) | ORAL | 0 refills | Status: DC | PRN

## 2019-09-24 MED ORDER — IBUPROFEN 800 MG PO TABS: 800.0000 mg | ORAL_TABLET | Freq: Three times a day (TID) | ORAL | 0 refills | Status: DC

## 2019-09-24 NOTE — ED Triage Notes (Signed)
R shoulder pain since yesterday, Hx of multiple dislocations, patient feels like shoulder is back in place but pain continues.
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
## 2019-09-24 NOTE — Discharge Instructions (Addendum)
Wear sling Take the ibuprofen 3 x a day with food Take the pain medicine as needed Do not drive on the pain medicine Followup with Dr Dion Saucier

## 2021-02-25 ENCOUNTER — Ambulatory Visit (INDEPENDENT_AMBULATORY_CARE_PROVIDER_SITE_OTHER): Payer: Self-pay

## 2021-02-25 ENCOUNTER — Encounter (HOSPITAL_COMMUNITY): Payer: Self-pay | Admitting: Emergency Medicine

## 2021-02-25 ENCOUNTER — Other Ambulatory Visit: Payer: Self-pay

## 2021-02-25 ENCOUNTER — Ambulatory Visit (HOSPITAL_COMMUNITY)
Admission: EM | Admit: 2021-02-25 | Discharge: 2021-02-25 | Disposition: A | Discharge status: Home / Self Care | Payer: Self-pay | Attending: Family Medicine | Admitting: Family Medicine

## 2021-02-25 DIAGNOSIS — M25511 Pain in right shoulder: Secondary | ICD-10-CM

## 2021-02-25 MED ORDER — CYCLOBENZAPRINE HCL 10 MG PO TABS: 10.0000 mg | ORAL_TABLET | Freq: Every evening | ORAL | 0 refills | Status: DC | PRN

## 2021-02-25 MED ORDER — IBUPROFEN 800 MG PO TABS: 800.0000 mg | ORAL_TABLET | Freq: Three times a day (TID) | ORAL | 0 refills | Status: DC

## 2021-02-25 NOTE — ED Provider Notes (Signed)
[facility]    CSN: [id number] Arrival date & time: 02/25/21  1032      History   Chief Complaint Chief Complaint  Patient presents with   Shoulder Pain    HPI Sean Conway is a 25 y.o. male.   Patient presenting today with 1 day history of right shoulder pain.  States he was wrestling around playing at home yesterday and his right shoulder came out of place but he was able to put it back in place.  He states this happens frequently, he was supposed to have surgery through orthopedics but his insurance was not covering it at the time so it has been postponed.  He denies numbness, tingling, weakness, swelling, discoloration.  Taking Aleve with mild temporary relief of symptoms.   Past Medical History:  Diagnosis Date   Broken ankle    Heart murmur    History of broken finger     There are no problems to display for this patient.   Past Surgical History:  Procedure Laterality Date   extraction of wisdom teeth         Home Medications    Prior to Admission medications   Medication Sig Start Date End Date Taking? Authorizing Provider  cyclobenzaprine (FLEXERIL) 10 MG tablet Take 1 tablet (10 mg total) by mouth at bedtime as needed for muscle spasms. Do not drink alcohol or drive while taking this medication.  May cause drowsiness. 02/25/21  Yes Particia Nearing, PA-C  HYDROcodone-acetaminophen (NORCO) 7.5-325 MG tablet Take 1 tablet by mouth every 6 (six) hours as needed for moderate pain. 09/24/19   Eustace Moore, MD  ibuprofen (ADVIL) 800 MG tablet Take 1 tablet (800 mg total) by mouth 3 (three) times daily. 02/25/21   Particia Nearing, PA-C    Family History Family History  Problem Relation Age of Onset   Diabetes Other     Social History Social History   Tobacco Use   Smoking status: Some Days    Types: Cigars   Smokeless tobacco: Never  Substance Use Topics   Alcohol use: Yes    Comment: occ   Drug use: No      Allergies   Patient has no known allergies.   Review of Systems Review of Systems Per HPI  Physical Exam Triage Vital Signs ED Triage Vitals  Enc Vitals Group     BP --      Pulse Rate 02/25/21 1302 67     Resp 02/25/21 1302 15     Temp 02/25/21 1302 98.1 F (36.7 C)     Temp Source 02/25/21 1302 Oral     SpO2 02/25/21 1302 98 %     Weight --      Height --      Head Circumference --      Peak Flow --      Pain Score 02/25/21 1301 8     Pain Loc --      Pain Edu? --      Excl. in GC? --    No data found.  Updated Vital Signs Pulse 67   Temp 98.1 F (36.7 C) (Oral)   Resp 15   SpO2 98%   Visual Acuity Right Eye Distance:   Left Eye Distance:   Bilateral Distance:    Right Eye Near:   Left Eye Near:    Bilateral Near:     Physical Exam Vitals and nursing note reviewed.  Constitutional:  Appearance: Normal appearance.  HENT:     Head: Atraumatic.  Eyes:     Extraocular Movements: Extraocular movements intact.     Conjunctiva/sclera: Conjunctivae normal.  Cardiovascular:     Rate and Rhythm: Normal rate and regular rhythm.  Pulmonary:     Effort: Pulmonary effort is normal.     Breath sounds: Normal breath sounds.  Musculoskeletal:        General: Tenderness present. No swelling or deformity. Normal range of motion.     Cervical back: Normal range of motion and neck supple.     Comments: Diffuse tenderness palpation right shoulder, decreased range of motion toward overhead and posteriorly.  Grip strength full and equal bilateral upper extremities.  Skin:    General: Skin is warm and dry.     Findings: No erythema or rash.  Neurological:     General: No focal deficit present.     Mental Status: He is oriented to person, place, and time.  Psychiatric:        Mood and Affect: Mood normal.        Thought Content: Thought content normal.        Judgment: Judgment normal.   UC Treatments / Results  Labs (all labs ordered are listed, but  only abnormal results are displayed) Labs Reviewed - No data to display  EKG   Radiology DG Shoulder Right  Result Date: 02/25/2021 CLINICAL DATA:  Right shoulder injury wrestling yesterday. EXAM: RIGHT SHOULDER - 2+ VIEW COMPARISON:  Right shoulder x-rays dated December 14, 2018. FINDINGS: There is no evidence of fracture or dislocation. There is no evidence of arthropathy or other focal bone abnormality. Soft tissues are unremarkable. IMPRESSION: Negative. Electronically Signed   By: Obie Dredge M.D.   On: 02/25/2021 14:03    Procedures Procedures (including critical care time)  Medications Ordered in UC Medications - No data to display  Initial Impression / Assessment and Plan / UC Course  I have reviewed the triage vital signs and the nursing notes.  Pertinent labs & imaging results that were available during my care of the patient were reviewed by me and considered in my medical decision making (see chart for details).     X-ray today showing no acute bony abnormality.  We will treat with ibuprofen, Flexeril at bedtime, rest.  Sling placed for comfort.  Follow-up with orthopedics for further evaluation.  Final Clinical Impressions(s) / UC Diagnoses   Final diagnoses:  Acute pain of right shoulder   Discharge Instructions   None    ED Prescriptions     Medication Sig Dispense Auth. Provider   ibuprofen (ADVIL) 800 MG tablet Take 1 tablet (800 mg total) by mouth 3 (three) times daily. 21 tablet Particia Nearing, [place]   cyclobenzaprine (FLEXERIL) 10 MG tablet Take 1 tablet (10 mg total) by mouth at bedtime as needed for muscle spasms. Do not drink alcohol or drive while taking this medication.  May cause drowsiness. 10 tablet Particia Nearing, [place]      PDMP not reviewed this encounter.   Particia Nearing, [place] 02/25/21 1423

## 2021-02-25 NOTE — ED Triage Notes (Signed)
Pt reports that last night horse playing around and right shoulder became out of place. Pt was able to put back in like has in past. Reports having right shoulder pains.

## 2021-12-29 ENCOUNTER — Ambulatory Visit (INDEPENDENT_AMBULATORY_CARE_PROVIDER_SITE_OTHER): Payer: Self-pay

## 2021-12-29 ENCOUNTER — Ambulatory Visit (HOSPITAL_COMMUNITY)
Admission: EM | Admit: 2021-12-29 | Discharge: 2021-12-29 | Disposition: A | Discharge status: Home / Self Care | Payer: Self-pay | Attending: Physician Assistant | Admitting: Physician Assistant

## 2021-12-29 ENCOUNTER — Encounter (HOSPITAL_COMMUNITY): Payer: Self-pay

## 2021-12-29 DIAGNOSIS — M545 Low back pain, unspecified: Secondary | ICD-10-CM

## 2021-12-29 MED ORDER — NAPROXEN 500 MG PO TABS: 500.0000 mg | ORAL_TABLET | Freq: Two times a day (BID) | ORAL | 0 refills | Status: AC

## 2021-12-29 MED ORDER — METHOCARBAMOL 500 MG PO TABS: 500.0000 mg | ORAL_TABLET | Freq: Two times a day (BID) | ORAL | 0 refills | Status: AC

## 2021-12-29 NOTE — ED Provider Notes (Signed)
[facility]    CSN: [id number] Arrival date & time: 12/29/21  1149      History   Chief Complaint Chief Complaint  Patient presents with   Back Pain    HPI Sean Conway is a 26 y.o. male.   Patient presents today with a 2-day history of low back pain.  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  He reports that pain is rated 8 at rest but increases to 10 with prolonged standing or ambulation, localized to lumbar spine, no aggravating or alleviating factors identified.  He does report that he had a some numbness immediately following the injury but has not had any ongoing unusual sensation or additional symptoms.  Denies any lower extremity weakness, saddle anesthesia, bowel/bladder incontinence.  Denies previous injury or surgery involving his back.  He has tried ibuprofen without improvement of symptoms.  Denies any urinary symptoms.  He is having difficulty with daily activities due to pain.    Past Medical History:  Diagnosis Date   Broken ankle    Heart murmur    History of broken finger     There are no problems to display for this patient.   Past Surgical History:  Procedure Laterality Date   extraction of wisdom teeth         Home Medications    Prior to Admission medications   Medication Sig Start Date End Date Taking? Authorizing Provider  methocarbamol (ROBAXIN) 500 MG tablet Take 1 tablet (500 mg total) by mouth 2 (two) times daily. 12/29/21  Yes Rashema Seawright K, PA-C  naproxen (NAPROSYN) 500 MG tablet Take 1 tablet (500 mg total) by mouth 2 (two) times daily. 12/29/21  Yes Magdeline Prange, Noberto Retort, PA-C    Family History Family History  Problem Relation Age of Onset   Diabetes Other     Social History Social History   Tobacco Use   Smoking status: Some Days    Types: Cigars   Smokeless tobacco: Never  Substance Use Topics   Alcohol use: Yes     Comment: occ   Drug use: No     Allergies   Patient has no known allergies.   Review of Systems Review of Systems  Constitutional:  Positive for activity change. Negative for appetite change, fatigue and fever.  Gastrointestinal:  Negative for abdominal pain, diarrhea, nausea and vomiting.  Genitourinary:  Negative for dysuria, frequency and urgency.  Musculoskeletal:  Positive for back pain. Negative for arthralgias and myalgias.  Neurological:  Negative for dizziness, weakness, light-headedness, numbness and headaches.     Physical Exam Triage Vital Signs ED Triage Vitals [12/29/21 1255]  Enc Vitals Group     BP (!) 150/97     Pulse Rate (!) 57     Resp 16     Temp 98.7 F (37.1 C)     Temp Source Oral     SpO2 98 %     Weight      Height      Head Circumference      Peak Flow      Pain Score      Pain Loc      Pain Edu?      Excl. in GC?    No data found.  Updated Vital Signs BP (!) 150/97 (BP Location: Left Arm)   Pulse (!) 57  Temp 98.7 F (37.1 C) (Oral)   Resp 16   SpO2 98%   Visual Acuity Right Eye Distance:   Left Eye Distance:   Bilateral Distance:    Right Eye Near:   Left Eye Near:    Bilateral Near:     Physical Exam Vitals reviewed.  Constitutional:      General: He is awake.     Appearance: Normal appearance. He is well-developed. He is not ill-appearing.     Comments: Very pleasant male appears stated age in no acute distress sitting comfortably in exam room  HENT:     Head: Normocephalic and atraumatic.     Mouth/Throat:     Pharynx: Uvula midline. No oropharyngeal exudate or posterior oropharyngeal erythema.  Cardiovascular:     Rate and Rhythm: Normal rate and regular rhythm.     Heart sounds: Normal heart sounds, S1 normal and S2 normal. No murmur heard. Pulmonary:     Effort: Pulmonary effort is normal.     Breath sounds: Normal breath sounds. No stridor. No wheezing, rhonchi or rales.     Comments: Clear to auscultation  bilaterally Abdominal:     Palpations: Abdomen is soft.     Tenderness: There is no abdominal tenderness.  Musculoskeletal:     Cervical back: No tenderness or bony tenderness.     Thoracic back: No tenderness or bony tenderness.     Lumbar back: Tenderness and bony tenderness present. Negative right straight leg raise test and negative left straight leg raise test.     Comments: Tenderness palpation over bilateral lumbar paraspinal muscles.  Pain with percussion of vertebrae.  No deformity or step-off noted.  Negative straight leg raise.  Strength 5/5 bilateral lower extremities.  Neurological:     Mental Status: He is alert.  Psychiatric:        Behavior: Behavior is cooperative.      UC Treatments / Results  Labs (all labs ordered are listed, but only abnormal results are displayed) Labs Reviewed - No data to display  EKG   Radiology DG Lumbar Spine Complete  Result Date: 12/29/2021 CLINICAL DATA:  Low back pain for 3 days.  Popping sound. EXAM: LUMBAR SPINE - COMPLETE 4+ VIEW COMPARISON:  None Available. FINDINGS: There are 5 non rib-bearing lumbar vertebrae. Vertebral alignment is normal. No fracture is identified. Intervertebral disc space heights are preserved. IMPRESSION: Negative. Electronically Signed   By: Logan Bores M.D.   On: 12/29/2021 14:30    Procedures Procedures (including critical care time)  Medications Ordered in UC Medications - No data to display  Initial Impression / Assessment and Plan / UC Course  I have reviewed the triage vital signs and the nursing notes.  Pertinent labs & imaging results that were available during my care of the patient were reviewed by me and considered in my medical decision making (see chart for details).     X-ray today given bony tenderness showed no osseous abnormality.  We will start Naprosyn twice daily.  He was instructed to avoid NSAIDs with this medication due to risk of GI bleeding.  Can use Tylenol for  breakthrough pain.  He was prescribed Robaxin up to twice a day as needed.  Discussed that this is sedating and he should not drive or drink alcohol with taking this medication.  Recommended conservative treatment measures including heat, rest, stretch.  Discussed that he should follow-up with a sports medicine provider as he may benefit from physical therapy and or more  advanced imaging which we are not able to arrange in urgent care.  He was given COVID information for local provider with instruction to call to schedule an appointment.  Discussed that if he has any worsening symptoms including increased pain, fever, nausea, vomiting, lower extremity weakness, saddle anesthesia, bowel/bladder incontinence he needs to go to the emergency room immediately.  Strict return precautions given.  Work excuse note provided.  Final Clinical Impressions(s) / UC Diagnoses   Final diagnoses:  Acute midline low back pain without sciatica     Discharge Instructions      Your x-ray was normal which makes me more concerned about a soft tissue injury.  Please start Naprosyn twice daily.  Do not take additional NSAIDs with this medication including aspirin, ibuprofen/Advil, naproxen/Aleve.  You can use Tylenol for breakthrough pain.  Take Robaxin up to twice a day.  This will make you sleepy do not drive or drink alcohol with taking it.  Use heat, rest, stretch for symptom relief.  I do recommend you follow-up with sports medicine as they may be able to refer to physical therapy or arrange additional imaging (such as an MRI).  Please call to schedule an appointment.  If you have any worsening symptoms including going to the bathroom on yourself without noticing it, lower extremity weakness, numbness or tingling in your legs, increased pain, fever you need to be seen immediately.     ED Prescriptions     Medication Sig Dispense Auth. Provider   naproxen (NAPROSYN) 500 MG tablet Take 1 tablet (500 mg total) by mouth  2 (two) times daily. 20 tablet Aharon Carriere K, PA-C   methocarbamol (ROBAXIN) 500 MG tablet Take 1 tablet (500 mg total) by mouth 2 (two) times daily. 20 tablet Tramel Westbrook, Noberto Retort, PA-C      PDMP not reviewed this encounter.   Jeani Hawking, PA-C 12/29/21 1446

## 2021-12-29 NOTE — Discharge Instructions (Signed)
Your x-ray was normal which makes me more concerned about a soft tissue injury.  Please start Naprosyn twice daily.  Do not take additional NSAIDs with this medication including aspirin, ibuprofen/Advil, naproxen/Aleve.  You can use Tylenol for breakthrough pain.  Take Robaxin up to twice a day.  This will make you sleepy do not drive or drink alcohol with taking it.  Use heat, rest, stretch for symptom relief.  I do recommend you follow-up with sports medicine as they may be able to refer to physical therapy or arrange additional imaging (such as an MRI).  Please call to schedule an appointment.  If you have any worsening symptoms including going to the bathroom on yourself without noticing it, lower extremity weakness, numbness or tingling in your legs, increased pain, fever you need to be seen immediately.

## 2021-12-29 NOTE — ED Triage Notes (Signed)
Pt present to the office for lower back pain x 3  days. Pt reports hearing a popping sound.

## 2023-06-15 ENCOUNTER — Emergency Department (HOSPITAL_COMMUNITY): Payer: Medicaid Other

## 2023-06-15 ENCOUNTER — Other Ambulatory Visit: Payer: Self-pay

## 2023-06-15 ENCOUNTER — Encounter (HOSPITAL_COMMUNITY): Payer: Self-pay

## 2023-06-15 ENCOUNTER — Emergency Department (HOSPITAL_COMMUNITY)
Admission: EM | Admit: 2023-06-15 | Discharge: 2023-06-15 | Discharge status: Against Medical Advice | Payer: Medicaid Other | Attending: Emergency Medicine | Admitting: Emergency Medicine

## 2023-06-15 DIAGNOSIS — Z5321 Procedure and treatment not carried out due to patient leaving prior to being seen by health care provider: Secondary | ICD-10-CM | POA: Diagnosis not present

## 2023-06-15 DIAGNOSIS — R079 Chest pain, unspecified: Secondary | ICD-10-CM | POA: Diagnosis present

## 2023-06-15 LAB — CBC
HCT: 40 % (ref 39.0–52.0)
Hemoglobin: 13.1 g/dL (ref 13.0–17.0)
MCH: 32.8 pg (ref 26.0–34.0)
MCHC: 32.8 g/dL (ref 30.0–36.0)
MCV: 100.3 fL — ABNORMAL HIGH (ref 80.0–100.0)
Platelets: 288 10*3/uL (ref 150–400)
RBC: 3.99 MIL/uL — ABNORMAL LOW (ref 4.22–5.81)
RDW: 13 % (ref 11.5–15.5)
WBC: 8.2 10*3/uL (ref 4.0–10.5)
nRBC: 0 % (ref 0.0–0.2)

## 2023-06-15 LAB — BASIC METABOLIC PANEL
Anion gap: 9 (ref 5–15)
BUN: 8 mg/dL (ref 6–20)
CO2: 25 mmol/L (ref 22–32)
Calcium: 9.2 mg/dL (ref 8.9–10.3)
Chloride: 106 mmol/L (ref 98–111)
Creatinine, Ser: 0.89 mg/dL (ref 0.61–1.24)
GFR, Estimated: >60 mL/min (ref >60)
Glucose, Bld: 75 mg/dL (ref 70–99)
Potassium: 4.2 mmol/L (ref 3.5–5.1)
Sodium: 140 mmol/L (ref 135–145)

## 2023-06-15 LAB — TROPONIN I (HIGH SENSITIVITY)
Troponin I (High Sensitivity): 3 ng/L (ref <18)
Troponin I (High Sensitivity): 3 ng/L (ref <18)

## 2023-06-15 NOTE — ED Triage Notes (Signed)
PT BIB [facility] c/o CP that started about 4 hours ago. Pt states it is in the middle of his chest. Pt denies any SHOB, N/V or dizziness. Pt states he has been under a lot of stress lately and this also happened about 2-3 weeks ago. Pt states it felt like pressure in the middle.

## 2023-06-15 NOTE — ED Notes (Signed)
Pt stated that he needed to leave for work, pt left the lobby at [phone].
